# Patient Record
Sex: Male | Born: 1950 | Race: White | Hispanic: No | Smoking: Never smoker
Health system: Southern US, Community
[De-identification: ages and names within clinical notes are randomized; demographics above are authoritative.]

## PROBLEM LIST (undated history)

## (undated) DIAGNOSIS — D500 Iron deficiency anemia secondary to blood loss (chronic): Secondary | ICD-10-CM

## (undated) DIAGNOSIS — D869 Sarcoidosis, unspecified: Secondary | ICD-10-CM

## (undated) DIAGNOSIS — Z992 Dependence on renal dialysis: Secondary | ICD-10-CM

## (undated) DIAGNOSIS — N186 End stage renal disease: Secondary | ICD-10-CM

## (undated) DIAGNOSIS — I2510 Atherosclerotic heart disease of native coronary artery without angina pectoris: Secondary | ICD-10-CM

## (undated) DIAGNOSIS — I10 Essential (primary) hypertension: Secondary | ICD-10-CM

## (undated) DIAGNOSIS — F32A Depression, unspecified: Secondary | ICD-10-CM

## (undated) DIAGNOSIS — I255 Ischemic cardiomyopathy: Secondary | ICD-10-CM

## (undated) DIAGNOSIS — I214 Non-ST elevation (NSTEMI) myocardial infarction: Secondary | ICD-10-CM

## (undated) DIAGNOSIS — J9600 Acute respiratory failure, unspecified whether with hypoxia or hypercapnia: Secondary | ICD-10-CM

## (undated) DIAGNOSIS — F329 Major depressive disorder, single episode, unspecified: Secondary | ICD-10-CM

## (undated) DIAGNOSIS — R570 Cardiogenic shock: Secondary | ICD-10-CM

## (undated) HISTORY — PX: OTHER SURGICAL HISTORY: SHX169

## (undated) HISTORY — PX: AV FISTULA PLACEMENT, RADIOCEPHALIC: SHX1208

---

## 2006-04-11 ENCOUNTER — Emergency Department (HOSPITAL_COMMUNITY): Admission: EM | Admit: 2006-04-11 | Discharge: 2006-04-11 | Payer: Self-pay | Admitting: Emergency Medicine

## 2006-06-25 HISTORY — PX: NEPHRECTOMY: SHX65

## 2006-08-25 ENCOUNTER — Emergency Department (HOSPITAL_COMMUNITY): Admission: EM | Admit: 2006-08-25 | Discharge: 2006-08-26 | Payer: Self-pay | Admitting: Emergency Medicine

## 2011-05-26 DIAGNOSIS — I214 Non-ST elevation (NSTEMI) myocardial infarction: Secondary | ICD-10-CM

## 2011-05-26 DIAGNOSIS — N186 End stage renal disease: Secondary | ICD-10-CM

## 2011-05-26 DIAGNOSIS — R570 Cardiogenic shock: Secondary | ICD-10-CM

## 2011-05-26 DIAGNOSIS — J9600 Acute respiratory failure, unspecified whether with hypoxia or hypercapnia: Secondary | ICD-10-CM

## 2011-05-26 DIAGNOSIS — D500 Iron deficiency anemia secondary to blood loss (chronic): Secondary | ICD-10-CM

## 2011-05-26 HISTORY — DX: Acute respiratory failure, unspecified whether with hypoxia or hypercapnia: J96.00

## 2011-05-26 HISTORY — PX: CORONARY ARTERY BYPASS GRAFT: SHX141

## 2011-05-26 HISTORY — DX: Non-ST elevation (NSTEMI) myocardial infarction: I21.4

## 2011-05-26 HISTORY — DX: End stage renal disease: N18.6

## 2011-05-26 HISTORY — DX: Cardiogenic shock: R57.0

## 2011-05-26 HISTORY — DX: Iron deficiency anemia secondary to blood loss (chronic): D50.0

## 2011-07-02 ENCOUNTER — Emergency Department (HOSPITAL_COMMUNITY): Payer: Medicare Other

## 2011-07-02 ENCOUNTER — Encounter: Payer: Self-pay | Admitting: *Deleted

## 2011-07-02 ENCOUNTER — Inpatient Hospital Stay (HOSPITAL_COMMUNITY)
Admission: EM | Admit: 2011-07-02 | Discharge: 2011-07-04 | DRG: 388 | Disposition: A | Discharge status: Home / Self Care | Payer: Medicare Other | Attending: Internal Medicine | Admitting: Internal Medicine

## 2011-07-02 ENCOUNTER — Other Ambulatory Visit: Payer: Self-pay

## 2011-07-02 DIAGNOSIS — D631 Anemia in chronic kidney disease: Secondary | ICD-10-CM | POA: Diagnosis present

## 2011-07-02 DIAGNOSIS — E119 Type 2 diabetes mellitus without complications: Secondary | ICD-10-CM | POA: Diagnosis present

## 2011-07-02 DIAGNOSIS — R9431 Abnormal electrocardiogram [ECG] [EKG]: Secondary | ICD-10-CM | POA: Diagnosis present

## 2011-07-02 DIAGNOSIS — I120 Hypertensive chronic kidney disease with stage 5 chronic kidney disease or end stage renal disease: Secondary | ICD-10-CM | POA: Diagnosis present

## 2011-07-02 DIAGNOSIS — I10 Essential (primary) hypertension: Secondary | ICD-10-CM | POA: Diagnosis present

## 2011-07-02 DIAGNOSIS — K560 Paralytic ileus: Principal | ICD-10-CM | POA: Diagnosis present

## 2011-07-02 DIAGNOSIS — N039 Chronic nephritic syndrome with unspecified morphologic changes: Secondary | ICD-10-CM | POA: Diagnosis present

## 2011-07-02 DIAGNOSIS — I2510 Atherosclerotic heart disease of native coronary artery without angina pectoris: Secondary | ICD-10-CM | POA: Diagnosis present

## 2011-07-02 DIAGNOSIS — L03319 Cellulitis of trunk, unspecified: Secondary | ICD-10-CM | POA: Diagnosis present

## 2011-07-02 DIAGNOSIS — K567 Ileus, unspecified: Secondary | ICD-10-CM | POA: Diagnosis present

## 2011-07-02 DIAGNOSIS — I252 Old myocardial infarction: Secondary | ICD-10-CM

## 2011-07-02 DIAGNOSIS — L02219 Cutaneous abscess of trunk, unspecified: Secondary | ICD-10-CM | POA: Diagnosis present

## 2011-07-02 DIAGNOSIS — T360X5A Adverse effect of penicillins, initial encounter: Secondary | ICD-10-CM | POA: Diagnosis present

## 2011-07-02 DIAGNOSIS — D500 Iron deficiency anemia secondary to blood loss (chronic): Secondary | ICD-10-CM | POA: Diagnosis present

## 2011-07-02 DIAGNOSIS — D649 Anemia, unspecified: Secondary | ICD-10-CM

## 2011-07-02 DIAGNOSIS — N186 End stage renal disease: Secondary | ICD-10-CM | POA: Diagnosis present

## 2011-07-02 DIAGNOSIS — E860 Dehydration: Secondary | ICD-10-CM

## 2011-07-02 DIAGNOSIS — E870 Hyperosmolality and hypernatremia: Secondary | ICD-10-CM | POA: Diagnosis present

## 2011-07-02 DIAGNOSIS — D869 Sarcoidosis, unspecified: Secondary | ICD-10-CM | POA: Diagnosis present

## 2011-07-02 DIAGNOSIS — R112 Nausea with vomiting, unspecified: Secondary | ICD-10-CM | POA: Diagnosis present

## 2011-07-02 DIAGNOSIS — Z951 Presence of aortocoronary bypass graft: Secondary | ICD-10-CM

## 2011-07-02 DIAGNOSIS — L03313 Cellulitis of chest wall: Secondary | ICD-10-CM | POA: Diagnosis present

## 2011-07-02 HISTORY — DX: Ischemic cardiomyopathy: I25.5

## 2011-07-02 HISTORY — DX: Acute respiratory failure, unspecified whether with hypoxia or hypercapnia: J96.00

## 2011-07-02 HISTORY — DX: End stage renal disease: N18.6

## 2011-07-02 HISTORY — DX: Iron deficiency anemia secondary to blood loss (chronic): D50.0

## 2011-07-02 HISTORY — DX: Essential (primary) hypertension: I10

## 2011-07-02 HISTORY — DX: Major depressive disorder, single episode, unspecified: F32.9

## 2011-07-02 HISTORY — DX: Sarcoidosis, unspecified: D86.9

## 2011-07-02 HISTORY — DX: Cardiogenic shock: R57.0

## 2011-07-02 HISTORY — DX: Dependence on renal dialysis: Z99.2

## 2011-07-02 HISTORY — DX: Depression, unspecified: F32.A

## 2011-07-02 HISTORY — DX: Non-ST elevation (NSTEMI) myocardial infarction: I21.4

## 2011-07-02 HISTORY — DX: Atherosclerotic heart disease of native coronary artery without angina pectoris: I25.10

## 2011-07-02 LAB — COMPREHENSIVE METABOLIC PANEL
AST: 17 U/L (ref 0–37)
Albumin: 3 g/dL — ABNORMAL LOW (ref 3.5–5.2)
Alkaline Phosphatase: 88 U/L (ref 39–117)
BUN: 23 mg/dL (ref 6–23)
Chloride: 102 mEq/L (ref 96–112)
Potassium: 3.9 mEq/L (ref 3.5–5.1)
Total Bilirubin: 0.6 mg/dL (ref 0.3–1.2)

## 2011-07-02 LAB — CARDIAC PANEL(CRET KIN+CKTOT+MB+TROPI)
CK, MB: 5.3 ng/mL — ABNORMAL HIGH (ref 0.3–4.0)
Relative Index: INVALID (ref 0.0–2.5)
Total CK: 66 U/L (ref 7–232)
Troponin I: <0.3 ng/mL (ref <0.30)

## 2011-07-02 LAB — DIFFERENTIAL
Basophils Absolute: 0 10*3/uL (ref 0.0–0.1)
Lymphocytes Relative: 7 % — ABNORMAL LOW (ref 12–46)
Lymphs Abs: 0.6 10*3/uL — ABNORMAL LOW (ref 0.7–4.0)
Monocytes Absolute: 0.9 10*3/uL (ref 0.1–1.0)
Neutro Abs: 7.8 10*3/uL — ABNORMAL HIGH (ref 1.7–7.7)

## 2011-07-02 LAB — CBC
HCT: 35.6 % — ABNORMAL LOW (ref 39.0–52.0)
Hemoglobin: 11.1 g/dL — ABNORMAL LOW (ref 13.0–17.0)
RBC: 3.92 MIL/uL — ABNORMAL LOW (ref 4.22–5.81)
RDW: 16.9 % — ABNORMAL HIGH (ref 11.5–15.5)
WBC: 9.5 10*3/uL (ref 4.0–10.5)

## 2011-07-02 LAB — PRO B NATRIURETIC PEPTIDE: Pro B Natriuretic peptide (BNP): 32482 pg/mL — ABNORMAL HIGH (ref 0–125)

## 2011-07-02 MED ORDER — ONDANSETRON HCL 4 MG/2ML IJ SOLN
4.0000 mg | Freq: Once | INTRAMUSCULAR | Status: AC
  Administered 2011-07-02: 4 mg via INTRAVENOUS
  Filled 2011-07-02: qty 2

## 2011-07-02 MED ORDER — ENOXAPARIN SODIUM 30 MG/0.3ML ~~LOC~~ SOLN
30.0000 mg | SUBCUTANEOUS | Status: DC
  Administered 2011-07-02 – 2011-07-03 (×2): 30 mg via SUBCUTANEOUS
  Filled 2011-07-02: qty 0.3
  Filled 2011-07-02: qty 1

## 2011-07-02 MED ORDER — PANTOPRAZOLE SODIUM 40 MG IV SOLR
40.0000 mg | INTRAVENOUS | Status: DC
  Administered 2011-07-02 – 2011-07-03 (×2): 40 mg via INTRAVENOUS
  Filled 2011-07-02 (×2): qty 40

## 2011-07-02 MED ORDER — VANCOMYCIN HCL IN DEXTROSE 1-5 GM/200ML-% IV SOLN
1000.0000 mg | Freq: Once | INTRAVENOUS | Status: AC
  Administered 2011-07-02: 1000 mg via INTRAVENOUS
  Filled 2011-07-02: qty 200

## 2011-07-02 MED ORDER — SODIUM CHLORIDE 0.9 % IV BOLUS (SEPSIS)
500.0000 mL | Freq: Once | INTRAVENOUS | Status: AC
  Administered 2011-07-02: 10:00:00 via INTRAVENOUS

## 2011-07-02 MED ORDER — INSULIN ASPART 100 UNIT/ML ~~LOC~~ SOLN
0.0000 [IU] | SUBCUTANEOUS | Status: DC
  Administered 2011-07-03: 1 [IU] via SUBCUTANEOUS
  Administered 2011-07-03: 2 [IU] via SUBCUTANEOUS
  Administered 2011-07-03 (×2): 1 [IU] via SUBCUTANEOUS
  Administered 2011-07-03 – 2011-07-04 (×2): 2 [IU] via SUBCUTANEOUS
  Administered 2011-07-04: 1 [IU] via SUBCUTANEOUS
  Filled 2011-07-02: qty 3

## 2011-07-02 MED ORDER — PROMETHAZINE HCL 25 MG/ML IJ SOLN
12.5000 mg | Freq: Once | INTRAMUSCULAR | Status: AC
  Administered 2011-07-02: 12.5 mg via INTRAVENOUS
  Filled 2011-07-02: qty 1

## 2011-07-02 MED ORDER — HYDROCORTISONE SOD SUCCINATE 100 MG IJ SOLR
25.0000 mg | Freq: Every day | INTRAMUSCULAR | Status: DC
  Administered 2011-07-02: 22:00:00 via INTRAVENOUS
  Administered 2011-07-03: 25 mg via INTRAVENOUS
  Filled 2011-07-02 (×4): qty 2

## 2011-07-02 MED ORDER — ONDANSETRON HCL 4 MG/2ML IJ SOLN
4.0000 mg | Freq: Four times a day (QID) | INTRAMUSCULAR | Status: DC
  Administered 2011-07-02 – 2011-07-04 (×7): 4 mg via INTRAVENOUS
  Filled 2011-07-02 (×8): qty 2

## 2011-07-02 MED ORDER — LEVALBUTEROL HCL 0.63 MG/3ML IN NEBU: 0.6300 mg | INHALATION_SOLUTION | RESPIRATORY_TRACT | Status: DC | PRN

## 2011-07-02 MED ORDER — ACETAMINOPHEN 325 MG PO TABS
650.0000 mg | ORAL_TABLET | Freq: Four times a day (QID) | ORAL | Status: DC | PRN
  Administered 2011-07-04: 650 mg via ORAL
  Filled 2011-07-02: qty 2

## 2011-07-02 MED ORDER — MORPHINE SULFATE 2 MG/ML IJ SOLN
4.0000 mg | INTRAMUSCULAR | Status: DC | PRN
  Filled 2011-07-02: qty 2

## 2011-07-02 MED ORDER — DEXTROSE-NACL 5-0.9 % IV SOLN
INTRAVENOUS | Status: DC
  Administered 2011-07-02: 21:00:00 via INTRAVENOUS

## 2011-07-02 MED ORDER — GUAIFENESIN-DM 100-10 MG/5ML PO SYRP: 5.0000 mL | ORAL_SOLUTION | ORAL | Status: DC | PRN

## 2011-07-02 MED ORDER — METOPROLOL TARTRATE 1 MG/ML IV SOLN
5.0000 mg | Freq: Four times a day (QID) | INTRAVENOUS | Status: DC
  Administered 2011-07-03 (×2): 5 mg via INTRAVENOUS
  Filled 2011-07-02 (×3): qty 5

## 2011-07-02 MED ORDER — MORPHINE SULFATE 4 MG/ML IJ SOLN
2.0000 mg | Freq: Once | INTRAMUSCULAR | Status: AC
  Administered 2011-07-02: 2 mg via INTRAVENOUS
  Filled 2011-07-02: qty 1

## 2011-07-02 MED ORDER — ACETAMINOPHEN 650 MG RE SUPP: 650.0000 mg | Freq: Four times a day (QID) | RECTAL | Status: DC | PRN

## 2011-07-02 NOTE — ED Provider Notes (Signed)
Pt seen with PA Idol EKG reviewed Pt with recent CABG now with recurrent vomiting abd soft Sternotomy incision well healing Labs noted, unclear how acute troponin elevation is s/p CABD Will need consultation with his physician at [facility] BP 132/78  Pulse 85  Temp(Src) 97.8 F (36.6 C) (Oral)  Resp 16  Ht 5\' 9"  (1.753 m)  Wt 147 lb 11.3 oz (67 kg)  BMI 21.81 kg/m2  SpO2 100%   Joya Gaskins, MD 07/02/11 1057

## 2011-07-02 NOTE — ED Notes (Signed)
Leona Singleton PA aware of Trop value. Orders to get records from [facility] for recent bypass surgery.

## 2011-07-02 NOTE — ED Notes (Signed)
Pt unable to eat since discharge from bypass surgery at [facility] week prior (surgery Tuesday, discharged Saturday). Pt complaining of N/V with headache and anxiety.

## 2011-07-02 NOTE — ED Notes (Signed)
[facility] called back about records that were requested. Will fax to [facility]

## 2011-07-02 NOTE — ED Notes (Signed)
Dr. Sherrie Mustache here for evaluation of pt

## 2011-07-02 NOTE — H&P (Addendum)
Juan Rowe MRN: [id number] DOB/AGE: 1951/04/20 61 y.o. Primary Care Physician: [facility] Admit date: 07/02/2011 Chief Complaint: Nausea and vomiting HPI: The patient is a 61 year old man with a past medical history significant for coronary artery disease, type 2 diabetes mellitus, and chronic kidney disease. His history is also significant for a recent hospitalization at [facility] from 06/09/2011 through 06/30/2011 for treatment of a non-ST elevation myocardial infarction. During the hospitalization there, he developed cardiogenic shock and acute respiratory failure necessitating intubation and ventilation. Subsequently, he underwent a two-vessel CABG. His renal function worsened and therefore dialysis was started. Because of blood loss, he was transfused 2 units of packed red blood cells. During his last day at [facility], his appetite was poor and he was eating very little. Since the time that he had returned home, he has had several episodes of nausea and vomiting. He has only mild abdominal pain, but his major complaint is of nausea and vomiting. He denies coffee grounds emesis. His last bowel movement was earlier this morning. He has no pain with urination. He has no fever or chills. He has no chest pain or shortness of breath.  In the emergency department, he is noted to be afebrile and hemodynamically stable. His lab data are significant for a normal troponin I., elevated serum sodium of 147, elevated creatinine of 4.52, mild anemia with a hemoglobin of 11.1, and an abdominal x-ray that reveals findings suggestive of an ileus versus partial small bowel obstruction. His lipase and LFTs were within normal limits. He is being admitted for further evaluation and management.  Past Medical History  Diagnosis Date  . Coronary artery disease   . MI, acute, non ST segment elevation December 2012    [facility]  . Diabetes mellitus   . Ischemic cardiomyopathy     . End stage renal disease on dialysis December 2012    Started dialysis  . Sarcoidosis   . Gout   . Renal cell carcinoma 2008  . Hypertension   . Blood loss anemia December 2012    Status post CABG. Transfused 2 units blood  . Acute respiratory failure December 2012    Secondary to MI  . Cardiogenic shock December 2012  . Depression     Past Surgical History  Procedure Date  . Coronary artery bypass graft December 2012    Two-vessel, [facility].  . Nephrectomy 2008    On the left  . Double-j ureteral stent placed   . Av fistula placement, radiocephalic     Prior to Admission medications   Medication Sig Start Date End Date Taking? Authorizing Provider  acetaminophen (TYLENOL) 325 MG tablet Take 325 mg by mouth every 6 (six) hours as needed.     Yes Historical Provider, MD  allopurinol (ZYLOPRIM) 100 MG tablet Take 100 mg by mouth every Monday, Wednesday, and Friday.     Yes Historical Provider, MD  amoxicillin-clavulanate (AUGMENTIN) 500-125 MG per tablet Take 1 tablet by mouth at bedtime.   06/30/11 07/14/11 Yes Historical Provider, MD  aspirin EC 81 MG tablet Take 81 mg by mouth daily.     Yes Historical Provider, MD  calcitRIOL (ROCALTROL) 0.25 MCG capsule Take 0.25 mcg by mouth every Monday, Wednesday, and Friday.     Yes Historical Provider, MD  calcium acetate (PHOSLO) 667 MG capsule Take 667 mg by mouth 3 (three) times daily with meals.     Yes Historical Provider, MD  docusate sodium (COLACE)  100 MG capsule Take 100 mg by mouth 2 (two) times daily as needed. For constipation    Yes Historical Provider, MD  escitalopram (LEXAPRO) 10 MG tablet Take 10 mg by mouth daily.     Yes Historical Provider, MD  insulin aspart (NOVOLOG) 100 UNIT/ML injection Inject 3 Units into the skin 3 (three) times daily before meals.     Yes Historical Provider, MD  insulin glargine (LANTUS) 100 UNIT/ML injection Inject 14 Units into the skin at bedtime.     Yes Historical Provider, MD   metoprolol tartrate (LOPRESSOR) 25 MG tablet Take 12.5 mg by mouth 2 (two) times daily.     Yes Historical Provider, MD  omeprazole (PRILOSEC) 20 MG capsule Take 20 mg by mouth daily.     Yes Historical Provider, MD  oxyCODONE (OXY IR/ROXICODONE) 5 MG immediate release tablet Take 5 mg by mouth every 6 (six) hours as needed. For pain    Yes Historical Provider, MD  predniSONE (DELTASONE) 2.5 MG tablet Take 7.5 mg by mouth daily.     Yes Historical Provider, MD  senna (SENOKOT) 8.6 MG TABS Take 2 tablets by mouth at bedtime as needed. For constipation    Yes Historical Provider, MD  simvastatin (ZOCOR) 40 MG tablet Take 40 mg by mouth every evening.     Yes Historical Provider, MD  triamcinolone cream (KENALOG) 0.1 % Apply 1 application topically 2 (two) times daily.     Yes Historical Provider, MD    Allergies:  Allergies  Allergen Reactions  . Hydrocodone Nausea Only  . Sudafed (Pseudoephedrine)     Family history: His mother is deceased due to cancer. His father is deceased due to COPD.  Social History: He is separated. He has one daughter. He lives in [place]. He is retired from [employer]. He denies alcohol, tobacco, and illicit drug use.      ROS: As above in history present illness. Otherwise review of systems is negative.  PHYSICAL EXAM: Blood pressure 117/64, pulse 94, temperature 97.8 F (36.6 C), temperature source Oral, resp. rate 16, height 5\' 9"  (1.753 m), weight 67 kg (147 lb 11.3 oz), SpO2 95.00%.  General: Pleasant 61 year old Caucasian man who is currently lying in bed, in no acute distress. HEENT: Head is normocephalic, nontraumatic. Pupils are equal, round, and reactive to light. Extraocular movements are intact. Conjunctivae are clear. Sclerae are white. Oropharynx reveals mildly dry mucous membranes. No posterior exudates or erythema. Neck: Supple, no adenopathy, no thyromegaly, no JVD. Chest wall: Dialysis catheter located at the right upper chest wall.  Mild erythema around the catheter. There is a a healing sternotomy scar with surrounding erythema but no active drainage. Mildly tender to palpation. Lungs: Decreased breath sounds in the bases, otherwise clear. Heart: S1, S2, with an ectopic beat and soft systolic murmur. Abdomen: Positive bowel sounds, soft, mildly tender in the epigastrium, mildly distended, no masses palpated, no hepatosplenomegaly. Extremities: No pedal edema. Palpable AV fistula without strong thrill. Neurologic: Alert and oriented x3. Cranial nerves II through XII are intact.  Basic Metabolic Panel:  Basename 07/02/11 1029  NA 147*  K 3.9  CL 102  CO2 28  GLUCOSE 122*  BUN 23  CREATININE 4.52*  CALCIUM 9.3  MG --  PHOS --   Liver Function Tests:  Basename 07/02/11 1029  AST 17  ALT 13  ALKPHOS 88  BILITOT 0.6  PROT 6.0  ALBUMIN 3.0*    Basename 07/02/11 1029  LIPASE 52  AMYLASE --  No results found for this basename: AMMONIA:2 in the last 72 hours CBC:  Basename 07/02/11 1028  WBC 9.5  NEUTROABS 7.8*  HGB 11.1*  HCT 35.6*  MCV 90.8  PLT 312   Cardiac Enzymes:  Basename 07/02/11 1028  CKTOTAL --  CKMB --  CKMBINDEX --  TROPONINI <0.30   BNP: No results found for this basename: PROBNP:3 in the last 72 hours D-Dimer: No results found for this basename: DDIMER:2 in the last 72 hours CBG: No results found for this basename: GLUCAP:6 in the last 72 hours Hemoglobin A1C: No results found for this basename: HGBA1C in the last 72 hours Fasting Lipid Panel: No results found for this basename: CHOL,HDL,LDLCALC,TRIG,CHOLHDL,LDLDIRECT in the last 72 hours Thyroid Function Tests: No results found for this basename: TSH,T4TOTAL,FREET4,T3FREE,THYROIDAB in the last 72 hours Anemia Panel: No results found for this basename: VITAMINB12,FOLATE,FERRITIN,TIBC,IRON,RETICCTPCT in the last 72 hours Coagulation: No results found for this basename: LABPROT:2,INR:2 in the last 72 hours Urine Drug  Screen: Drugs of Abuse  No results found for this basename: labopia, cocainscrnur, labbenz, amphetmu, thcu, labbarb    Alcohol Level: No results found for this basename: ETH:2 in the last 72 hours    EKG: Heart rate 82 these per minute, ST and T wave abnormalities in the lateral leads, prolonged QT interval of 422/493 to  No results found for this or any previous visit (from the past 240 hour(s)).   Results for orders placed during the hospital encounter of 07/02/11 (from the past 48 hour(s))  POCT I-STAT TROPONIN I     Status: Abnormal   Collection Time   07/02/11 10:21 AM      Component Value Range Comment   Troponin i, poc 0.15 (*) 0.00 - 0.08 (ng/mL)    Comment NOTIFIED PHYSICIAN      Comment 3            CBC     Status: Abnormal   Collection Time   07/02/11 10:28 AM      Component Value Range Comment   WBC 9.5  4.0 - 10.5 (K/uL)    RBC 3.92 (*) 4.22 - 5.81 (MIL/uL)    Hemoglobin 11.1 (*) 13.0 - 17.0 (g/dL)    HCT 16.1 (*) 09.6 - 52.0 (%)    MCV 90.8  78.0 - 100.0 (fL)    MCH 28.3  26.0 - 34.0 (pg)    MCHC 31.2  30.0 - 36.0 (g/dL)    RDW 04.5 (*) 40.9 - 15.5 (%)    Platelets 312  150 - 400 (K/uL)   DIFFERENTIAL     Status: Abnormal   Collection Time   07/02/11 10:28 AM      Component Value Range Comment   Neutrophils Relative 82 (*) 43 - 77 (%)    Neutro Abs 7.8 (*) 1.7 - 7.7 (K/uL)    Lymphocytes Relative 7 (*) 12 - 46 (%)    Lymphs Abs 0.6 (*) 0.7 - 4.0 (K/uL)    Monocytes Relative 9  3 - 12 (%)    Monocytes Absolute 0.9  0.1 - 1.0 (K/uL)    Eosinophils Relative 2  0 - 5 (%)    Eosinophils Absolute 0.2  0.0 - 0.7 (K/uL)    Basophils Relative 0  0 - 1 (%)    Basophils Absolute 0.0  0.0 - 0.1 (K/uL)   TROPONIN I     Status: Normal   Collection Time   07/02/11 10:28 AM      Component  Value Range Comment   Troponin I <0.30  <0.30 (ng/mL)   COMPREHENSIVE METABOLIC PANEL     Status: Abnormal   Collection Time   07/02/11 10:29 AM      Component Value Range Comment    Sodium 147 (*) 135 - 145 (mEq/L)    Potassium 3.9  3.5 - 5.1 (mEq/L)    Chloride 102  96 - 112 (mEq/L)    CO2 28  19 - 32 (mEq/L)    Glucose, Bld 122 (*) 70 - 99 (mg/dL)    BUN 23  6 - 23 (mg/dL)    Creatinine, Ser 1.61 (*) 0.50 - 1.35 (mg/dL)    Calcium 9.3  8.4 - 10.5 (mg/dL)    Total Protein 6.0  6.0 - 8.3 (g/dL)    Albumin 3.0 (*) 3.5 - 5.2 (g/dL)    AST 17  0 - 37 (U/L)    ALT 13  0 - 53 (U/L)    Alkaline Phosphatase 88  39 - 117 (U/L)    Total Bilirubin 0.6  0.3 - 1.2 (mg/dL)    GFR calc non Af Amer 13 (*) >90 (mL/min)    GFR calc Af Amer 15 (*) >90 (mL/min)   LIPASE, BLOOD     Status: Normal   Collection Time   07/02/11 10:29 AM      Component Value Range Comment   Lipase 52  11 - 59 (U/L)     Dg Chest 1 View  07/02/2011  *RADIOLOGY REPORT*  Clinical Data: Cough and shortness of breath.  Rule out pneumonia. Lateral view requested by radiologist.  CHEST - 1 VIEW  Comparison: Portable chest x-ray 07/02/2011  Findings: When compared to the frontal portable chest x-ray from earlier today, the findings on the frontal portable chest x-ray appear to be linear opacities most consistent with subsegmental atelectasis.  In addition, there is minimal blunting of the left costophrenic sulcus, indicative of a small left-sided pleural effusion.  The patient is status post median sternotomy for CABG. A perm catheter is in place with tip projecting in the expected location of the superior cavoatrial junction.  IMPRESSION:  1.  Together with findings from the portable view of the chest from earlier today, the appearance is most consistent with a small left- sided pleural effusion with adjacent subsegmental atelectasis in the left lower lobe.  No definite focal airspace consolidation is appreciated to suggest pneumonia at this time.  Original Report Authenticated By: Florencia Reasons, M.D.   Dg Abd 1 View  07/02/2011  *RADIOLOGY REPORT*  Clinical Data: Vomiting.  ABDOMEN - 1 VIEW  Comparison: None.   Findings: There are gas-filled loops of small bowel in the left abdomen.  There is gas within the colon.  Multiple surgical clips throughout the abdomen.  Limited evaluation for free air.  IMPRESSION: Air-filled loops of small bowel in the left abdomen with gas in the colon.  Differential diagnosis includes an ileus versus early small bowel obstruction.  Original Report Authenticated By: Richarda Overlie, M.D.   Dg Chest Portable 1 View  07/02/2011  *RADIOLOGY REPORT*  Clinical Data: Vomiting.  PORTABLE CHEST - 1 VIEW  Comparison: None.  Findings: Right internal jugular central venous catheter tip projects over the lower SVC.  Prior CABG noted.  There is retrodiaphragmatic opacity on the left which is not entirely specific, and could reflect atelectasis or pneumonia.  Lateral radiography, if feasible, would be recommended.  Linear subsegmental atelectasis noted in the left midlung. Low lung volumes  are present, causing crowding of the pulmonary vasculature. Heart given the low lung volumes, heart size is within normal limits.  IMPRESSION:  1.  Retrodiaphragmatic opacity on the left, not entirely specific. This could be from atelectasis, a small pleural effusion, or pneumonia.  If feasible, lateral chest radiography may be helpful in differentiation.  2.  Subsegmental atelectasis in the left midlung.  Original Report Authenticated By: Dellia Cloud, M.D.    Impression:  Active Problems:  Nausea and vomiting  Ileus  End stage renal disease  Hypernatremia  Diabetes mellitus type 2 in nonobese  Coronary artery disease  Hypertension  Anemia  Cellulitis of chest wall  Prolonged Q-T interval on ECG   1. Intractable nausea and vomiting, likely secondary to ileus versus early small bowel obstruction. He did have a bowel movement this morning. His abdomen is soft and only mildly distended.  2. Coronary artery/ischemic cardiomyopathy/recent 2 vessel CABG. Noted QT prolongation on the EKG which may be the  consequence of recent MI and CABG.  3. End stage renal disease, on dialysis which started during the hospitalization last month at [facility]  4. Cellulitis of the chest wall, status post sternotomy/CABG.  5. Hypertension. Currently controlled.  6. Type 2 diabetes mellitus, currently controlled.  7. Hypernatremia, likely secondary to dehydration from nausea and vomiting.  8. Anemia. The patient developed acute blood loss anemia during the hospitalization in December at [facility] He was transfused. Currently, his hemoglobin is not very low under circumstances.  Plan:  1. The patient will be made n.p.o. for bowel rest. We'll treat him supportively with antiemetics and analgesics IV. We will add Protonix intravenously. We will schedule Zofran every 6 hours.  Nephrologist Dr. Kristian Covey has been consulted and he will see the patient tomorrow. He plans to dialyze him.  Sliding scale NovoLog for treatment of diabetes.  IV hydrocortisone will be given in place of oral prednisone.  Gentle IV fluids for hydration.  Vancomycin for treatment of cellulitis of the chest wall.  For further evaluation, we'll order cardiac enzymes, TSH, hemoglobin A1c, pro BNP, and magnesium level.  We'll consider transferring the patient back to [facility] for continuity of care. We'll hold off on general surgery consultation for now.       Lan Mcneill 07/02/2011, 6:53 PM

## 2011-07-02 NOTE — ED Notes (Signed)
C/o headache. Will notify Burgess Amor PA

## 2011-07-02 NOTE — ED Provider Notes (Signed)
History     CSN: [id number]  Arrival date & time 07/02/11  4401   First MD Initiated Contact with Patient 07/02/11 1001      Chief Complaint  Patient presents with  . Nausea  . Emesis  . Anxiety  . Headache    (Consider location/radiation/quality/duration/timing/severity/associated sxs/prior treatment) HPI Comments: Patient presents for evaluation of nausea,  Vomiting, and increasing dehydration.  He was admitted at [facility] 06/09/11 with an acute MI,  And underwent subsequent CABG x 2.  On 06/21/11.   During this admission,  His dialysis was also instituted,  Having had ESRD with acute on chronic renal failure during this admission.  He was supposed to see Dr Fausto Skillern this am to arrange for his next dialysis tomorrow,  But missed this appointment since he was here in the ed.  He continues to have nausea with epigastric discomfort,  Made worse with attempt at PO fluids here in the ed.  He reports increased weakness and feeling dehydrated.  Patient is a 61 y.o. male presenting with vomiting. The history is provided by the patient.  Emesis  This is a recurrent (Patient presents with anorexia,  nausea and vomiting when he tried to eat or drink since he was discharged from [facility] 2 days ago.) problem. The problem occurs more than 10 times per day. The emesis has an appearance of stomach contents (Also with dry heaves.). There has been no fever. Associated symptoms include abdominal pain. Pertinent negatives include no arthralgias, no cough, no diarrhea, no fever and no headaches. Associated symptoms comments: Reports anxiety..    Past Medical History  Diagnosis Date  . Cancer   . Coronary artery disease   . Diabetes mellitus   . Hypertension   . Renal disorder   . MI (myocardial infarction)   . Gout     Past Surgical History  Procedure Date  . Cardiac surgery   . Kidney surgery     History reviewed. No pertinent family history.  History  Substance Use Topics  . Smoking  status: Not on file  . Smokeless tobacco: Not on file  . Alcohol Use:       Review of Systems  Constitutional: Positive for appetite change and fatigue. Negative for fever.  HENT: Negative for congestion, sore throat and neck pain.   Eyes: Negative.   Respiratory: Negative for cough, chest tightness and shortness of breath.   Cardiovascular: Negative for chest pain and palpitations.  Gastrointestinal: Positive for nausea, vomiting and abdominal pain. Negative for diarrhea and abdominal distention.  Genitourinary: Positive for decreased urine volume.  Musculoskeletal: Negative for joint swelling and arthralgias.  Skin: Negative.  Negative for rash and wound.  Neurological: Positive for weakness. Negative for dizziness, light-headedness, numbness and headaches.  Hematological: Negative.   Psychiatric/Behavioral: Negative.     Allergies  Hydrocodone and Sudafed  Home Medications   Current Outpatient Rx  Name Route Sig Dispense Refill  . ACETAMINOPHEN 325 MG PO TABS Oral Take 325 mg by mouth every 6 (six) hours as needed.      . ALLOPURINOL 100 MG PO TABS Oral Take 100 mg by mouth every Monday, Wednesday, and Friday.      . AMOXICILLIN-POT CLAVULANATE 500-125 MG PO TABS Oral Take 1 tablet by mouth at bedtime.      . ASPIRIN EC 81 MG PO TBEC Oral Take 81 mg by mouth daily.      Marland Kitchen CALCITRIOL 0.25 MCG PO CAPS Oral Take 0.25 mcg by mouth  every Monday, Wednesday, and Friday.      Marland Kitchen CALCIUM ACETATE 667 MG PO CAPS Oral Take 667 mg by mouth 3 (three) times daily with meals.      . DOCUSATE SODIUM 100 MG PO CAPS Oral Take 100 mg by mouth 2 (two) times daily as needed. For constipation     . ESCITALOPRAM OXALATE 10 MG PO TABS Oral Take 10 mg by mouth daily.      . INSULIN ASPART 100 UNIT/ML [place] SOLN Subcutaneous Inject 3 Units into the skin 3 (three) times daily before meals.      . INSULIN GLARGINE 100 UNIT/ML [place] SOLN Subcutaneous Inject 14 Units into the skin at bedtime.      Marland Kitchen METOPROLOL  TARTRATE 25 MG PO TABS Oral Take 12.5 mg by mouth 2 (two) times daily.      Marland Kitchen OMEPRAZOLE 20 MG PO CPDR Oral Take 20 mg by mouth daily.      . OXYCODONE HCL 5 MG PO TABS Oral Take 5 mg by mouth every 6 (six) hours as needed. For pain     . PREDNISONE 2.5 MG PO TABS Oral Take 7.5 mg by mouth daily.      . SENNA 8.6 MG PO TABS Oral Take 2 tablets by mouth at bedtime as needed. For constipation     . SIMVASTATIN 40 MG PO TABS Oral Take 40 mg by mouth every evening.      . TRIAMCINOLONE ACETONIDE 0.1 % EX CREA Topical Apply 1 application topically 2 (two) times daily.        BP 123/64  Pulse 93  Temp(Src) 97.8 F (36.6 C) (Oral)  Resp 16  Ht 5\' 9"  (1.753 m)  Wt 147 lb 11.3 oz (67 kg)  BMI 21.81 kg/m2  SpO2 95%  Physical Exam  Nursing note and vitals reviewed. Constitutional: He is oriented to person, place, and time. He appears well-developed and well-nourished.  HENT:  Head: Normocephalic and atraumatic.  Eyes: Conjunctivae are normal.  Neck: Normal range of motion.  Cardiovascular: Normal rate, regular rhythm, normal heart sounds and intact distal pulses.   Pulmonary/Chest: Effort normal and breath sounds normal. He has no wheezes. He has no rales. He exhibits tenderness.       Mild midline tenderness.  No erythema or swelling suggestive of incisional infection.  Abdominal: Soft. Bowel sounds are normal. He exhibits no mass. There is no hepatosplenomegaly. There is tenderness in the epigastric area. There is no rebound, no guarding and no CVA tenderness.  Musculoskeletal: Normal range of motion.  Neurological: He is alert and oriented to person, place, and time.  Skin: Skin is warm and dry.  Psychiatric: He has a normal mood and affect.    ED Course  Procedures (including critical care time)  Labs Reviewed  CBC - Abnormal; Notable for the following:    RBC 3.92 (*)    Hemoglobin 11.1 (*)    HCT 35.6 (*)    RDW 16.9 (*)    All other components within normal limits    DIFFERENTIAL - Abnormal; Notable for the following:    Neutrophils Relative 82 (*)    Neutro Abs 7.8 (*)    Lymphocytes Relative 7 (*)    Lymphs Abs 0.6 (*)    All other components within normal limits  COMPREHENSIVE METABOLIC PANEL - Abnormal; Notable for the following:    Sodium 147 (*)    Glucose, Bld 122 (*)    Creatinine, Ser 4.52 (*)  Albumin 3.0 (*)    GFR calc non Af Amer 13 (*)    GFR calc Af Amer 15 (*)    All other components within normal limits  POCT I-STAT TROPONIN I - Abnormal; Notable for the following:    Troponin i, poc 0.15 (*)    All other components within normal limits  TROPONIN I  LIPASE, BLOOD   Dg Chest 1 View  07/02/2011  *RADIOLOGY REPORT*  Clinical Data: Cough and shortness of breath.  Rule out pneumonia. Lateral view requested by radiologist.  CHEST - 1 VIEW  Comparison: Portable chest x-ray 07/02/2011  Findings: When compared to the frontal portable chest x-ray from earlier today, the findings on the frontal portable chest x-ray appear to be linear opacities most consistent with subsegmental atelectasis.  In addition, there is minimal blunting of the left costophrenic sulcus, indicative of a small left-sided pleural effusion.  The patient is status post median sternotomy for CABG. A perm catheter is in place with tip projecting in the expected location of the superior cavoatrial junction.  IMPRESSION:  1.  Together with findings from the portable view of the chest from earlier today, the appearance is most consistent with a small left- sided pleural effusion with adjacent subsegmental atelectasis in the left lower lobe.  No definite focal airspace consolidation is appreciated to suggest pneumonia at this time.  Original Report Authenticated By: Florencia Reasons, M.D.   Dg Chest Portable 1 View  07/02/2011  *RADIOLOGY REPORT*  Clinical Data: Vomiting.  PORTABLE CHEST - 1 VIEW  Comparison: None.  Findings: Right internal jugular central venous catheter tip  projects over the lower SVC.  Prior CABG noted.  There is retrodiaphragmatic opacity on the left which is not entirely specific, and could reflect atelectasis or pneumonia.  Lateral radiography, if feasible, would be recommended.  Linear subsegmental atelectasis noted in the left midlung. Low lung volumes are present, causing crowding of the pulmonary vasculature. Heart given the low lung volumes, heart size is within normal limits.  IMPRESSION:  1.  Retrodiaphragmatic opacity on the left, not entirely specific. This could be from atelectasis, a small pleural effusion, or pneumonia.  If feasible, lateral chest radiography may be helpful in differentiation.  2.  Subsegmental atelectasis in the left midlung.  Original Report Authenticated By: Dellia Cloud, M.D.     No diagnosis found.    Date: 07/02/2011  Rate: 82  Rhythm: normal sinus rhythm  QRS Axis: normal  Intervals: QT prolonged  ST/T Wave abnormalities: T wave inversion in lateral leads I and avl.  Conduction Disutrbances:none  Narrative Interpretation:   Old EKG Reviewed: unchanged  Old ekg from [facility] faxed chart dtd 06/22/11       MDM  Spoke with Dr. Juel Burrow of cvts at [facility] - discussed patients symptoms,  Labs.  Does not accept patient for transfer as sx do not sound like post surgical complication - with no sob or chest pain,  Suggests treating nausea,  Dehydration,  Arrange for dialysis here as originally planned.   Call to Dr Sherrie Mustache with [facility].  Will admit patient.  Patient also discussed with Dr Fausto Skillern who is aware of his admission and need for following for dialysis.        Candis Musa, PA 07/02/11 1800  Candis Musa, PA 07/02/11 [phone]

## 2011-07-02 NOTE — ED Notes (Signed)
Pt has no appetite to eat applesauce given and unable to tolerate drink

## 2011-07-03 ENCOUNTER — Encounter (HOSPITAL_COMMUNITY): Payer: Self-pay

## 2011-07-03 LAB — COMPREHENSIVE METABOLIC PANEL
BUN: 25 mg/dL — ABNORMAL HIGH (ref 6–23)
CO2: 27 mEq/L (ref 19–32)
Calcium: 8 mg/dL — ABNORMAL LOW (ref 8.4–10.5)
Creatinine, Ser: 4.4 mg/dL — ABNORMAL HIGH (ref 0.50–1.35)
GFR calc Af Amer: 15 mL/min — ABNORMAL LOW (ref >90)
GFR calc non Af Amer: 13 mL/min — ABNORMAL LOW (ref >90)
Glucose, Bld: 152 mg/dL — ABNORMAL HIGH (ref 70–99)
Sodium: 143 mEq/L (ref 135–145)
Total Protein: 5.5 g/dL — ABNORMAL LOW (ref 6.0–8.3)

## 2011-07-03 LAB — GLUCOSE, CAPILLARY
Glucose-Capillary: 144 mg/dL — ABNORMAL HIGH (ref 70–99)
Glucose-Capillary: 131 mg/dL — ABNORMAL HIGH (ref 70–99)
Glucose-Capillary: 147 mg/dL — ABNORMAL HIGH (ref 70–99)
Glucose-Capillary: 169 mg/dL — ABNORMAL HIGH (ref 70–99)

## 2011-07-03 LAB — CARDIAC PANEL(CRET KIN+CKTOT+MB+TROPI)
Relative Index: INVALID (ref 0.0–2.5)
Relative Index: INVALID (ref 0.0–2.5)
Total CK: 54 U/L (ref 7–232)
Troponin I: <0.3 ng/mL (ref <0.30)

## 2011-07-03 LAB — CBC
HCT: 29.1 % — ABNORMAL LOW (ref 39.0–52.0)
Hemoglobin: 8.9 g/dL — ABNORMAL LOW (ref 13.0–17.0)
MCHC: 30.6 g/dL (ref 30.0–36.0)
RBC: 3.19 MIL/uL — ABNORMAL LOW (ref 4.22–5.81)
WBC: 8.9 10*3/uL (ref 4.0–10.5)

## 2011-07-03 LAB — TSH: TSH: 0.265 u[IU]/mL — ABNORMAL LOW (ref 0.350–4.500)

## 2011-07-03 LAB — MRSA PCR SCREENING: MRSA by PCR: NEGATIVE

## 2011-07-03 LAB — PRO B NATRIURETIC PEPTIDE: Pro B Natriuretic peptide (BNP): 29888 pg/mL — ABNORMAL HIGH (ref 0–125)

## 2011-07-03 LAB — HEMOGLOBIN A1C: Hgb A1c MFr Bld: 6.6 % — ABNORMAL HIGH (ref <5.7)

## 2011-07-03 MED ORDER — METOPROLOL TARTRATE 25 MG PO TABS
12.5000 mg | ORAL_TABLET | Freq: Two times a day (BID) | ORAL | Status: DC
  Administered 2011-07-03 – 2011-07-04 (×3): 12.5 mg via ORAL
  Filled 2011-07-03 (×3): qty 1

## 2011-07-03 MED ORDER — FUROSEMIDE 10 MG/ML IJ SOLN
100.0000 mg | Freq: Two times a day (BID) | INTRAVENOUS | Status: DC
  Administered 2011-07-03 – 2011-07-04 (×2): 100 mg via INTRAVENOUS
  Filled 2011-07-03 (×5): qty 10

## 2011-07-03 MED ORDER — SODIUM CHLORIDE 0.9 % IJ SOLN
3.0000 mL | Freq: Two times a day (BID) | INTRAMUSCULAR | Status: DC
  Administered 2011-07-03 – 2011-07-04 (×2): 3 mL via INTRAVENOUS
  Filled 2011-07-03 (×2): qty 3

## 2011-07-03 MED ORDER — BIOTENE DRY MOUTH MT LIQD
15.0000 mL | Freq: Two times a day (BID) | OROMUCOSAL | Status: DC
  Administered 2011-07-03 – 2011-07-04 (×4): 15 mL via OROMUCOSAL

## 2011-07-03 MED ORDER — ASPIRIN 81 MG PO CHEW
81.0000 mg | CHEWABLE_TABLET | Freq: Every day | ORAL | Status: DC
  Administered 2011-07-03 – 2011-07-04 (×2): 81 mg via ORAL
  Filled 2011-07-03 (×2): qty 1

## 2011-07-03 MED ORDER — MUPIROCIN 2 % EX OINT: 1.0000 "application " | TOPICAL_OINTMENT | Freq: Two times a day (BID) | CUTANEOUS | Status: DC

## 2011-07-03 MED ORDER — CHLORHEXIDINE GLUCONATE CLOTH 2 % EX PADS: 6.0000 | MEDICATED_PAD | Freq: Every day | CUTANEOUS | Status: DC

## 2011-07-03 MED ORDER — VANCOMYCIN HCL IN DEXTROSE 1-5 GM/200ML-% IV SOLN: 1000.0000 mg | INTRAVENOUS | Status: DC

## 2011-07-03 MED ORDER — PREDNISONE 5 MG PO TABS
7.5000 mg | ORAL_TABLET | Freq: Every day | ORAL | Status: DC
  Administered 2011-07-04: 7.5 mg via ORAL
  Filled 2011-07-03: qty 2

## 2011-07-03 NOTE — Progress Notes (Signed)
UR Chart Review Completed  

## 2011-07-03 NOTE — Consult Note (Signed)
Reason for Consult: Chronic renal failure Referring Physician: Dr. Warrick Parisian is an 61 y.o. male.  HPI: He is a patient with long-standing history of diabetes chronic renal failure recently and discharged from [facility] reversal hospital after he was admitted to MI. During his stay patient renal function gets worse to the point where he would require dialysis. Patient patient received dialysis for 3 days and when he was discharged patient was sent to be followed as an outpatient. Patient because of her severe nausea and vomiting was brought to the emergency room where he has possible ileus. Presently patient is feeling better. He denies any difficulty breathing, orthopnea, or paroxysmal nocturnal dyspnea.  Past Medical History  Diagnosis Date  . Coronary artery disease   . MI, acute, non ST segment elevation December 2012    [facility]  . Diabetes mellitus   . Ischemic cardiomyopathy   . End stage renal disease on dialysis December 2012    Started dialysis  . Sarcoidosis   . Gout   . Renal cell carcinoma 2008  . Hypertension   . Blood loss anemia December 2012    Status post CABG. Transfused 2 units blood  . Acute respiratory failure December 2012    Secondary to MI  . Cardiogenic shock December 2012  . Depression     Past Surgical History  Procedure Date  . Nephrectomy 2008    On the left  . Double-j ureteral stent placed   . Av fistula placement, radiocephalic   . Coronary artery bypass graft December 2012    Two-vessel, [facility].    History reviewed. No pertinent family history.  Social History:  reports that he has never smoked. He has never used smokeless tobacco. He reports that he does not drink alcohol. His drug history not on file.  Allergies:  Allergies  Allergen Reactions  . Hydrocodone Nausea Only  . Sudafed (Pseudoephedrine)     Medications: I have reviewed the patient's current medications.  Results for orders placed during  the hospital encounter of 07/02/11 (from the past 48 hour(s))  POCT I-STAT TROPONIN I     Status: Abnormal   Collection Time   07/02/11 10:21 AM      Component Value Range Comment   Troponin i, poc 0.15 (*) 0.00 - 0.08 (ng/mL)    Comment NOTIFIED PHYSICIAN      Comment 3            CBC     Status: Abnormal   Collection Time   07/02/11 10:28 AM      Component Value Range Comment   WBC 9.5  4.0 - 10.5 (K/uL)    RBC 3.92 (*) 4.22 - 5.81 (MIL/uL)    Hemoglobin 11.1 (*) 13.0 - 17.0 (g/dL)    HCT 14.7 (*) 82.9 - 52.0 (%)    MCV 90.8  78.0 - 100.0 (fL)    MCH 28.3  26.0 - 34.0 (pg)    MCHC 31.2  30.0 - 36.0 (g/dL)    RDW 56.2 (*) 13.0 - 15.5 (%)    Platelets 312  150 - 400 (K/uL)   DIFFERENTIAL     Status: Abnormal   Collection Time   07/02/11 10:28 AM      Component Value Range Comment   Neutrophils Relative 82 (*) 43 - 77 (%)    Neutro Abs 7.8 (*) 1.7 - 7.7 (K/uL)    Lymphocytes Relative 7 (*) 12 - 46 (%)  Lymphs Abs 0.6 (*) 0.7 - 4.0 (K/uL)    Monocytes Relative 9  3 - 12 (%)    Monocytes Absolute 0.9  0.1 - 1.0 (K/uL)    Eosinophils Relative 2  0 - 5 (%)    Eosinophils Absolute 0.2  0.0 - 0.7 (K/uL)    Basophils Relative 0  0 - 1 (%)    Basophils Absolute 0.0  0.0 - 0.1 (K/uL)   TROPONIN I     Status: Normal   Collection Time   07/02/11 10:28 AM      Component Value Range Comment   Troponin I <0.30  <0.30 (ng/mL)   COMPREHENSIVE METABOLIC PANEL     Status: Abnormal   Collection Time   07/02/11 10:29 AM      Component Value Range Comment   Sodium 147 (*) 135 - 145 (mEq/L)    Potassium 3.9  3.5 - 5.1 (mEq/L)    Chloride 102  96 - 112 (mEq/L)    CO2 28  19 - 32 (mEq/L)    Glucose, Bld 122 (*) 70 - 99 (mg/dL)    BUN 23  6 - 23 (mg/dL)    Creatinine, Ser 1.61 (*) 0.50 - 1.35 (mg/dL)    Calcium 9.3  8.4 - 10.5 (mg/dL)    Total Protein 6.0  6.0 - 8.3 (g/dL)    Albumin 3.0 (*) 3.5 - 5.2 (g/dL)    AST 17  0 - 37 (U/L)    ALT 13  0 - 53 (U/L)    Alkaline Phosphatase 88  39 - 117  (U/L)    Total Bilirubin 0.6  0.3 - 1.2 (mg/dL)    GFR calc non Af Amer 13 (*) >90 (mL/min)    GFR calc Af Amer 15 (*) >90 (mL/min)   LIPASE, BLOOD     Status: Normal   Collection Time   07/02/11 10:29 AM      Component Value Range Comment   Lipase 52  11 - 59 (U/L)   CARDIAC PANEL(CRET KIN+CKTOT+MB+TROPI)     Status: Abnormal   Collection Time   07/02/11  7:13 PM      Component Value Range Comment   Total CK 66  7 - 232 (U/L)    CK, MB 5.3 (*) 0.3 - 4.0 (ng/mL)    Troponin I <0.30  <0.30 (ng/mL)    Relative Index RELATIVE INDEX IS INVALID  0.0 - 2.5    PRO B NATRIURETIC PEPTIDE     Status: Abnormal   Collection Time   07/02/11  7:13 PM      Component Value Range Comment   Pro B Natriuretic peptide (BNP) 32482.0 (*) 0 - 125 (pg/mL)   GLUCOSE, CAPILLARY     Status: Abnormal   Collection Time   07/02/11  9:18 PM      Component Value Range Comment   Glucose-Capillary 105 (*) 70 - 99 (mg/dL)   GLUCOSE, CAPILLARY     Status: Abnormal   Collection Time   07/03/11 12:41 AM      Component Value Range Comment   Glucose-Capillary 144 (*) 70 - 99 (mg/dL)   CARDIAC PANEL(CRET KIN+CKTOT+MB+TROPI)     Status: Abnormal   Collection Time   07/03/11  1:22 AM      Component Value Range Comment   Total CK 54  7 - 232 (U/L)    CK, MB 5.0 (*) 0.3 - 4.0 (ng/mL)    Troponin I <0.30  <0.30 (ng/mL)  Relative Index RELATIVE INDEX IS INVALID  0.0 - 2.5    MRSA PCR SCREENING     Status: Normal   Collection Time   07/03/11  3:33 AM      Component Value Range Comment   MRSA by PCR NEGATIVE  NEGATIVE    GLUCOSE, CAPILLARY     Status: Abnormal   Collection Time   07/03/11  4:34 AM      Component Value Range Comment   Glucose-Capillary 154 (*) 70 - 99 (mg/dL)   COMPREHENSIVE METABOLIC PANEL     Status: Abnormal   Collection Time   07/03/11  6:14 AM      Component Value Range Comment   Sodium 143  135 - 145 (mEq/L)    Potassium 4.0  3.5 - 5.1 (mEq/L)    Chloride 108  96 - 112 (mEq/L)    CO2 27  19 - 32  (mEq/L)    Glucose, Bld 152 (*) 70 - 99 (mg/dL)    BUN 25 (*) 6 - 23 (mg/dL)    Creatinine, Ser 1.61 (*) 0.50 - 1.35 (mg/dL)    Calcium 8.0 (*) 8.4 - 10.5 (mg/dL)    Total Protein 5.5 (*) 6.0 - 8.3 (g/dL)    Albumin 2.4 (*) 3.5 - 5.2 (g/dL)    AST 15  0 - 37 (U/L)    ALT 9  0 - 53 (U/L)    Alkaline Phosphatase 71  39 - 117 (U/L)    Total Bilirubin 0.3  0.3 - 1.2 (mg/dL)    GFR calc non Af Amer 13 (*) >90 (mL/min)    GFR calc Af Amer 15 (*) >90 (mL/min)   CBC     Status: Abnormal   Collection Time   07/03/11  6:14 AM      Component Value Range Comment   WBC 8.9  4.0 - 10.5 (K/uL)    RBC 3.19 (*) 4.22 - 5.81 (MIL/uL)    Hemoglobin 8.9 (*) 13.0 - 17.0 (g/dL)    HCT 09.6 (*) 04.5 - 52.0 (%)    MCV 91.2  78.0 - 100.0 (fL)    MCH 27.9  26.0 - 34.0 (pg)    MCHC 30.6  30.0 - 36.0 (g/dL)    RDW 40.9 (*) 81.1 - 15.5 (%)    Platelets 255  150 - 400 (K/uL)   CARDIAC PANEL(CRET KIN+CKTOT+MB+TROPI)     Status: Abnormal   Collection Time   07/03/11  6:14 AM      Component Value Range Comment   Total CK 53  7 - 232 (U/L)    CK, MB 4.6 (*) 0.3 - 4.0 (ng/mL)    Troponin I <0.30  <0.30 (ng/mL)    Relative Index RELATIVE INDEX IS INVALID  0.0 - 2.5    MAGNESIUM     Status: Normal   Collection Time   07/03/11  6:14 AM      Component Value Range Comment   Magnesium 1.8  1.5 - 2.5 (mg/dL)   PRO B NATRIURETIC PEPTIDE     Status: Abnormal   Collection Time   07/03/11  6:24 AM      Component Value Range Comment   Pro B Natriuretic peptide (BNP) 29888.0 (*) 0 - 125 (pg/mL)   GLUCOSE, CAPILLARY     Status: Abnormal   Collection Time   07/03/11  7:40 AM      Component Value Range Comment   Glucose-Capillary 131 (*) 70 - 99 (mg/dL)    Comment  1 Notify RN       Dg Chest 1 View  07/02/2011  *RADIOLOGY REPORT*  Clinical Data: Cough and shortness of breath.  Rule out pneumonia. Lateral view requested by radiologist.  CHEST - 1 VIEW  Comparison: Portable chest x-ray 07/02/2011  Findings: When compared to the  frontal portable chest x-ray from earlier today, the findings on the frontal portable chest x-ray appear to be linear opacities most consistent with subsegmental atelectasis.  In addition, there is minimal blunting of the left costophrenic sulcus, indicative of a small left-sided pleural effusion.  The patient is status post median sternotomy for CABG. A perm catheter is in place with tip projecting in the expected location of the superior cavoatrial junction.  IMPRESSION:  1.  Together with findings from the portable view of the chest from earlier today, the appearance is most consistent with a small left- sided pleural effusion with adjacent subsegmental atelectasis in the left lower lobe.  No definite focal airspace consolidation is appreciated to suggest pneumonia at this time.  Original Report Authenticated By: Florencia Reasons, M.D.   Dg Abd 1 View  07/02/2011  *RADIOLOGY REPORT*  Clinical Data: Vomiting.  ABDOMEN - 1 VIEW  Comparison: None.  Findings: There are gas-filled loops of small bowel in the left abdomen.  There is gas within the colon.  Multiple surgical clips throughout the abdomen.  Limited evaluation for free air.  IMPRESSION: Air-filled loops of small bowel in the left abdomen with gas in the colon.  Differential diagnosis includes an ileus versus early small bowel obstruction.  Original Report Authenticated By: Richarda Overlie, M.D.   Dg Chest Portable 1 View  07/02/2011  *RADIOLOGY REPORT*  Clinical Data: Vomiting.  PORTABLE CHEST - 1 VIEW  Comparison: None.  Findings: Right internal jugular central venous catheter tip projects over the lower SVC.  Prior CABG noted.  There is retrodiaphragmatic opacity on the left which is not entirely specific, and could reflect atelectasis or pneumonia.  Lateral radiography, if feasible, would be recommended.  Linear subsegmental atelectasis noted in the left midlung. Low lung volumes are present, causing crowding of the pulmonary vasculature. Heart given the  low lung volumes, heart size is within normal limits.  IMPRESSION:  1.  Retrodiaphragmatic opacity on the left, not entirely specific. This could be from atelectasis, a small pleural effusion, or pneumonia.  If feasible, lateral chest radiography may be helpful in differentiation.  2.  Subsegmental atelectasis in the left midlung.  Original Report Authenticated By: Dellia Cloud, M.D.    Review of Systems  Constitutional: Positive for weight loss.  Respiratory: Negative for shortness of breath.   Cardiovascular: Negative for chest pain, leg swelling and PND.  Gastrointestinal: Positive for nausea, vomiting and abdominal pain. Negative for diarrhea.  Neurological: Positive for weakness.   Blood pressure 122/69, pulse 66, temperature 97.4 F (36.3 C), temperature source Oral, resp. rate 18, height 5\' 9"  (1.753 m), weight 67 kg (147 lb 11.3 oz), SpO2 97.00%. Physical Exam  Constitutional: He is oriented to person, place, and time. He appears distressed.  Eyes: Left eye exhibits discharge.  Neck: No JVD present.  Cardiovascular: Normal rate, regular rhythm and normal heart sounds.   No murmur heard. Respiratory: Breath sounds normal. No respiratory distress. He has no wheezes.  GI: There is tenderness. There is no rebound.  Musculoskeletal: He exhibits no edema.  Neurological: He is alert and oriented to person, place, and time.  Skin: Skin is dry.  Assessment/Plan:  problem #1 renal failure her chronic patient is status post hemodialysis on Friday his BUN and creatinine at this moment seems to be stable. He doesn't have any sign of fluid overload. Patient presently is none oliguric. Problem #2 history of hypertension his blood pressure is controlled very well Problem #3 history of coronary artery disease status post CABG he is a symptomatic. Problem #4 history of diabetes Problem #5 history of anemia possibly anemia of chronic disease as this moment iron deficiency anemia also and  to be entertained as a possibility. Problem #6 history of nausea vomiting this is thought to be secondary to ileus presently seems to be feeling better. 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 Recommendation: We'll DC IV fluid Will give him Lasix 100 mg IV twice a day We'll check his CBC and basic metabolic panel in the morning. If his renal function remains stable we'll hold dialysis otherwise will consider dialysis in the morning.   Nasire Reali S 07/03/2011, 11:38 AM

## 2011-07-03 NOTE — Progress Notes (Signed)
ANTIBIOTIC CONSULT NOTE - INITIAL  Pharmacy Consult for Vancomycin Indication:  Cellulitis of chest wall  Allergies  Allergen Reactions  . Hydrocodone Nausea Only  . Sudafed (Pseudoephedrine)     Patient Measurements: Height: 5\' 9"  (175.3 cm) Weight: 147 lb 11.3 oz (67 kg) IBW/kg (Calculated) : 70.7  Adjusted Body Weight: n/a  Vital Signs: Temp: 97.4 F (36.3 C) (01/08 0628) Temp src: Oral (01/08 0628) BP: 122/69 mmHg (01/08 0628) Pulse Rate: 66  (01/08 0628) Intake/Output from previous day: 01/07 0701 - 01/08 0700 In: 522 [I.V.:520; IV Piggyback:2] Out: -  Intake/Output from this shift: Total I/O In: 343.6 [P.O.:120; I.V.:223.6] Out: -   Labs:  Basename 07/03/11 0614 07/02/11 1029 07/02/11 1028  WBC 8.9 -- 9.5  HGB 8.9* -- 11.1*  PLT 255 -- 312  LABCREA -- -- --  CREATININE 4.40* 4.52* --   Estimated Creatinine Clearance: 16.5 ml/min (by C-G formula based on Cr of 4.4).    Microbiology: Recent Results (from the past 720 hour(s))  MRSA PCR SCREENING     Status: Normal   Collection Time   07/03/11  3:33 AM      Component Value Range Status Comment   MRSA by PCR NEGATIVE  NEGATIVE  Final     Medical History: Past Medical History  Diagnosis Date  . Coronary artery disease   . MI, acute, non ST segment elevation December 2012    [facility]  . Diabetes mellitus   . Ischemic cardiomyopathy   . End stage renal disease on dialysis December 2012    Started dialysis  . Sarcoidosis   . Gout   . Renal cell carcinoma 2008  . Hypertension   . Blood loss anemia December 2012    Status post CABG. Transfused 2 units blood  . Acute respiratory failure December 2012    Secondary to MI  . Cardiogenic shock December 2012  . Depression     Medications:  Scheduled:    . antiseptic oral rinse  15 mL Mouth Rinse BID  . aspirin  81 mg Oral Daily  . enoxaparin  30 mg Subcutaneous Q24H  . furosemide  100 mg Intravenous BID  . insulin aspart  0-9 Units  Subcutaneous Q4H  . metoprolol tartrate  12.5 mg Oral BID  . morphine  2 mg Intravenous Once  . ondansetron (ZOFRAN) IV  4 mg Intravenous Q6H  . pantoprazole (PROTONIX) IV  40 mg Intravenous Q24H  . predniSONE  7.5 mg Oral Q breakfast  . promethazine  12.5 mg Intravenous Once  . vancomycin  1,000 mg Intravenous Once  . DISCONTD: Chlorhexidine Gluconate Cloth  6 each Topical Q0600  . DISCONTD: hydrocortisone sod succinate (SOLU-CORTEF) injection  25 mg Intravenous Daily  . DISCONTD: metoprolol  5 mg Intravenous Q6H  . DISCONTD: mupirocin ointment  1 application Nasal BID  . DISCONTD: vancomycin  1,000 mg Intravenous Q48H   Assessment: Received 1000 mg of Vancomycin on 07-02-11. Last HD was on 06-29-11. No HD scheduled for today.  Goal of Therapy:  Trough level > 15 mcg/ml  Plan:   No Vancomycin today. Follow up HD schedule and dose accordingly.  Gilman Buttner, [place] J 07/03/2011,11:58 AM

## 2011-07-03 NOTE — Progress Notes (Signed)
Pt. Was started on dialysis at [facility] where he received 3 treatments. I attempted to contact [facility] for any records but was unable to make contact. Pt. Will be treated as a new start with labs ordered at [facility].

## 2011-07-03 NOTE — Progress Notes (Signed)
Subjective: The patient has no complaints of chest pain or chest congestion. He says that his nausea and vomiting have resolved. He has no complaints of abdominal pain. He was to try to eat.  Objective: Vital signs in last 24 hours: Filed Vitals:   07/02/11 2253 07/02/11 2347 07/03/11 0039 07/03/11 0628  BP: 124/67 108/70 128/73 122/69  Pulse: 89 85 80 66  Temp:  98.8 F (37.1 C) 97.1 F (36.2 C) 97.4 F (36.3 C)  TempSrc:  Oral Oral Oral  Resp: 20 20 20 18   Height:   5\' 9"  (1.753 m)   Weight:   67 kg (147 lb 11.3 oz)   SpO2: 96% 94% 97%     Intake/Output Summary (Last 24 hours) at 07/03/11 1130 Last data filed at 07/03/11 1000  Gross per 24 hour  Intake 865.58 ml  Output      0 ml  Net 865.58 ml    Weight change:   Physical exam: Lungs: Decreased breath sounds in the bases. Clear anteriorly. Breathing is nonlabored. Heart: S1, S2, with a soft systolic murmur. Chest wall: Sternotomy scar with mild surrounding erythema, less than yesterday. No active drainage. Nontender. Abdomen: Positive bowel sounds, soft, no obvious distention, nontender. Extremities: No pedal edema.  Lab Results: Basic Metabolic Panel:  Basename 07/03/11 0614 07/02/11 1029  NA 143 147*  K 4.0 3.9  CL 108 102  CO2 27 28  GLUCOSE 152* 122*  BUN 25* 23  CREATININE 4.40* 4.52*  CALCIUM 8.0* 9.3  MG 1.8 --  PHOS -- --   Liver Function Tests:  [facility] 07/03/11 0614 07/02/11 1029  AST 15 17  ALT 9 13  ALKPHOS 71 88  BILITOT 0.3 0.6  PROT 5.5* 6.0  ALBUMIN 2.4* 3.0*    Basename 07/02/11 1029  LIPASE 52  AMYLASE --   No results found for this basename: AMMONIA:2 in the last 72 hours CBC:  Basename 07/03/11 0614 07/02/11 1028  WBC 8.9 9.5  NEUTROABS -- 7.8*  HGB 8.9* 11.1*  HCT 29.1* 35.6*  MCV 91.2 90.8  PLT 255 312   Cardiac Enzymes:  Basename 07/03/11 0614 07/03/11 0122 07/02/11 1913  CKTOTAL 53 54 66  CKMB 4.6* 5.0* 5.3*  CKMBINDEX -- -- --  TROPONINI <0.30 <0.30 <0.30    BNP:  Basename 07/03/11 0624 07/02/11 1913  PROBNP 16109.6* 32482.0*   D-Dimer: No results found for this basename: DDIMER:2 in the last 72 hours CBG:  Basename 07/03/11 0740 07/03/11 0434 07/03/11 0041 07/02/11 2118  GLUCAP 131* 154* 144* 105*   Hemoglobin A1C: No results found for this basename: HGBA1C in the last 72 hours Fasting Lipid Panel: No results found for this basename: CHOL,HDL,LDLCALC,TRIG,CHOLHDL,LDLDIRECT in the last 72 hours Thyroid Function Tests: No results found for this basename: TSH,T4TOTAL,FREET4,T3FREE,THYROIDAB in the last 72 hours Anemia Panel: No results found for this basename: VITAMINB12,FOLATE,FERRITIN,TIBC,IRON,RETICCTPCT in the last 72 hours Coagulation: No results found for this basename: LABPROT:2,INR:2 in the last 72 hours Urine Drug Screen: Drugs of Abuse  No results found for this basename: labopia, cocainscrnur, labbenz, amphetmu, thcu, labbarb    Alcohol Level: No results found for this basename: ETH:2 in the last 72 hours    Micro: Recent Results (from the past 240 hour(s))  MRSA PCR SCREENING     Status: Normal   Collection Time   07/03/11  3:33 AM      Component Value Range Status Comment   MRSA by PCR NEGATIVE  NEGATIVE  Final  Studies/Results: Dg Chest 1 View  07/02/2011  *RADIOLOGY REPORT*  Clinical Data: Cough and shortness of breath.  Rule out pneumonia. Lateral view requested by radiologist.  CHEST - 1 VIEW  Comparison: Portable chest x-ray 07/02/2011  Findings: When compared to the frontal portable chest x-ray from earlier today, the findings on the frontal portable chest x-ray appear to be linear opacities most consistent with subsegmental atelectasis.  In addition, there is minimal blunting of the left costophrenic sulcus, indicative of a small left-sided pleural effusion.  The patient is status post median sternotomy for CABG. A perm catheter is in place with tip projecting in the expected location of the superior  cavoatrial junction.  IMPRESSION:  1.  Together with findings from the portable view of the chest from earlier today, the appearance is most consistent with a small left- sided pleural effusion with adjacent subsegmental atelectasis in the left lower lobe.  No definite focal airspace consolidation is appreciated to suggest pneumonia at this time.  Original Report Authenticated By: Florencia Reasons, M.D.   Dg Abd 1 View  07/02/2011  *RADIOLOGY REPORT*  Clinical Data: Vomiting.  ABDOMEN - 1 VIEW  Comparison: None.  Findings: There are gas-filled loops of small bowel in the left abdomen.  There is gas within the colon.  Multiple surgical clips throughout the abdomen.  Limited evaluation for free air.  IMPRESSION: Air-filled loops of small bowel in the left abdomen with gas in the colon.  Differential diagnosis includes an ileus versus early small bowel obstruction.  Original Report Authenticated By: Richarda Overlie, M.D.   Dg Chest Portable 1 View  07/02/2011  *RADIOLOGY REPORT*  Clinical Data: Vomiting.  PORTABLE CHEST - 1 VIEW  Comparison: None.  Findings: Right internal jugular central venous catheter tip projects over the lower SVC.  Prior CABG noted.  There is retrodiaphragmatic opacity on the left which is not entirely specific, and could reflect atelectasis or pneumonia.  Lateral radiography, if feasible, would be recommended.  Linear subsegmental atelectasis noted in the left midlung. Low lung volumes are present, causing crowding of the pulmonary vasculature. Heart given the low lung volumes, heart size is within normal limits.  IMPRESSION:  1.  Retrodiaphragmatic opacity on the left, not entirely specific. This could be from atelectasis, a small pleural effusion, or pneumonia.  If feasible, lateral chest radiography may be helpful in differentiation.  2.  Subsegmental atelectasis in the left midlung.  Original Report Authenticated By: Dellia Cloud, M.D.    Medications: I have reviewed the patient's  current medications.  Assessment: Active Problems:  Nausea and vomiting  Ileus  End stage renal disease  Hypernatremia  Diabetes mellitus type 2 in nonobese  Coronary artery disease  Hypertension  Anemia  Cellulitis of chest wall  Prolonged Q-T interval on ECG  1. Nausea and vomiting, likely secondary to ileus. His liver transaminases and lipase are within normal limits. His abdomen is less distended. His bowel sounds are active. A full liquid diet will be tried today.  Hypernatremia, secondary to volume depletion/dehydration. Resolved with gentle IV fluids.  End-stage renal disease. His BUN and creatinine are stable. Nephrology has been consulted.  Coronary artery disease/status post CABG/ischemic cardiomyopathy. His cardiac enzymes are essentially negative. His pro BNP is quite elevated, however, this could be a chronic elevation from the recent CABG and from end-stage renal disease. Clinically, he does not appear to have decompensated congestive heart failure. His ins and outs are positive approximately 900 cc do to the gentle  IV fluids. Next  Prolonged QT interval on EKG. His magnesium level is within normal limits.  Hypertension. His blood pressure is currently controlled. He is on IV metoprolol. This will be changed to by mouth if he can tolerate full liquid diet.  Type 2 diabetes mellitus. Currently stable and controlled on sliding scale NovoLog only.  Cellulitis of chest wall/status post sternotomy. There is less erythema on vancomycin.  Normocytic anemia. His hemoglobin has decreased with IV fluids only to hemodilution. He has residual anemia from blood loss during the previous hospitalization and from end-stage renal disease.  Sarcoidosis. Currently on IV hydrocortisone. This will be discontinued in favor of prednisone if he can tolerate orally.   Plan:  1. Decreased IV fluids to 50 cc an hour. Nephrology has been consulted. We will await to see if dialysis will be  done today.  Will advance his diet to a full liquid diet. We'll change his metoprolol to by mouth.  Continue supportive treatment. If he is able to tolerate a full lipid diet, we will advance as tolerated. Consider discharge to home tomorrow if no signs of nausea and vomiting or persistent ileus.   LOS: 1 day   Marv Alfrey 07/03/2011, 11:30 AM

## 2011-07-04 ENCOUNTER — Other Ambulatory Visit: Payer: Self-pay

## 2011-07-04 ENCOUNTER — Inpatient Hospital Stay (HOSPITAL_COMMUNITY): Payer: Medicare Other

## 2011-07-04 LAB — CBC
Hemoglobin: 8.5 g/dL — ABNORMAL LOW (ref 13.0–17.0)
MCH: 27.9 pg (ref 26.0–34.0)
MCHC: 30.8 g/dL (ref 30.0–36.0)
MCV: 90.5 fL (ref 78.0–100.0)
RBC: 3.05 MIL/uL — ABNORMAL LOW (ref 4.22–5.81)

## 2011-07-04 LAB — GLUCOSE, CAPILLARY

## 2011-07-04 LAB — BASIC METABOLIC PANEL
BUN: 26 mg/dL — ABNORMAL HIGH (ref 6–23)
CO2: 28 mEq/L (ref 19–32)
GFR calc non Af Amer: 13 mL/min — ABNORMAL LOW (ref >90)
Glucose, Bld: 108 mg/dL — ABNORMAL HIGH (ref 70–99)
Potassium: 3.5 mEq/L (ref 3.5–5.1)
Sodium: 144 mEq/L (ref 135–145)

## 2011-07-04 LAB — CARDIAC PANEL(CRET KIN+CKTOT+MB+TROPI)
CK, MB: 3.7 ng/mL (ref 0.3–4.0)
Relative Index: INVALID (ref 0.0–2.5)
Total CK: 53 U/L (ref 7–232)
Troponin I: <0.3 ng/mL (ref <0.30)

## 2011-07-04 LAB — HEPATITIS B SURFACE ANTIBODY,QUALITATIVE: Hep B S Ab: NEGATIVE

## 2011-07-04 LAB — PHOSPHORUS: Phosphorus: 3.8 mg/dL (ref 2.3–4.6)

## 2011-07-04 LAB — HEPATITIS B SURFACE ANTIGEN: Hepatitis B Surface Ag: NEGATIVE

## 2011-07-04 LAB — HEPATITIS B CORE ANTIBODY, TOTAL: Hep B Core Total Ab: NEGATIVE

## 2011-07-04 MED ORDER — ONDANSETRON HCL 4 MG PO TABS: 4.0000 mg | ORAL_TABLET | Freq: Four times a day (QID) | ORAL | Status: AC | PRN

## 2011-07-04 MED ORDER — FUROSEMIDE 20 MG PO TABS: 80.0000 mg | ORAL_TABLET | Freq: Two times a day (BID) | ORAL | Status: DC

## 2011-07-04 MED ORDER — POTASSIUM CHLORIDE CRYS ER 20 MEQ PO TBCR: 20.0000 meq | EXTENDED_RELEASE_TABLET | Freq: Every day | ORAL | Status: AC

## 2011-07-04 MED ORDER — PANTOPRAZOLE SODIUM 40 MG PO TBEC
40.0000 mg | DELAYED_RELEASE_TABLET | Freq: Every day | ORAL | Status: DC
  Administered 2011-07-04: 40 mg via ORAL
  Filled 2011-07-04: qty 1

## 2011-07-04 MED ORDER — POTASSIUM CHLORIDE CRYS ER 20 MEQ PO TBCR
40.0000 meq | EXTENDED_RELEASE_TABLET | Freq: Once | ORAL | Status: AC
  Administered 2011-07-04: 40 meq via ORAL
  Filled 2011-07-04: qty 2

## 2011-07-04 NOTE — Progress Notes (Signed)
Pt ambulated in hallway with assistance from standard walker and NT. Pt tolerated well. Pt walked approximately 120 ft. No complications noted.

## 2011-07-04 NOTE — Progress Notes (Signed)
The patient has no complaints of nausea or vomiting and is receiving Zofran as scheduled. However, patient states that he is experiencing a "tenderness" in his left shoulder area that he feels may be related to his incision.  V/S 109/61, 64, 20, 98.0, 98%.

## 2011-07-04 NOTE — ED Provider Notes (Signed)
Medical screening examination/treatment/procedure(s) were conducted as a shared visit with non-physician practitioner(s) and myself.  I personally evaluated the patient during the encounter   Deshunda Thackston W Yehonatan Grandison, MD 07/04/11 1516 

## 2011-07-04 NOTE — Progress Notes (Signed)
The patient is receiving Protonix by the intravenous route.  Based on criteria approved by the Pharmacy and Therapeutics Committee and the Medical Executive Committee, the medication is being converted to the equivalent oral dose form.  These criteria include: -No Active GI bleeding -Able to tolerate diet of full liquids (or better) or tube feeding -Able to tolerate other medications by the oral or enteral route  If you have any questions about this conversion, please contact the Pharmacy Department (ext [phone]).  Thank you.  S. Kyleah Pensabene, PharmD  

## 2011-07-04 NOTE — Progress Notes (Signed)
Juan Rowe  MRN: [id number]  DOB/AGE: October 02, 1950 61 y.o.  Primary Care Physician:No primary provider on file.  Admit date: 07/02/2011  Chief Complaint:  Chief Complaint  Patient presents with  . Nausea  . Emesis  . Anxiety  . Headache    S-Pt presented on  07/02/2011 with  Chief Complaint  Patient presents with  . Nausea  . Emesis  . Anxiety  . Headache  .    Pt today feels better.      Marland Kitchen antiseptic oral rinse  15 mL Mouth Rinse BID  . aspirin  81 mg Oral Daily  . enoxaparin  30 mg Subcutaneous Q24H  . furosemide  100 mg Intravenous BID  . insulin aspart  0-9 Units Subcutaneous Q4H  . metoprolol tartrate  12.5 mg Oral BID  . ondansetron (ZOFRAN) IV  4 mg Intravenous Q6H  . pantoprazole  40 mg Oral QAC breakfast  . predniSONE  7.5 mg Oral Q breakfast  . sodium chloride  3 mL Intravenous Q12H  . DISCONTD: pantoprazole (PROTONIX) IV  40 mg Intravenous Q24H         MVH:QIONG from the symptoms mentioned above,there are no other symptoms referable to all systems reviewed.  Physical Exam: Vital signs in last 24 hours: Temp:  [98 F (36.7 C)-98.5 F (36.9 C)] 98 F (36.7 C) (01/09 0605) Pulse Rate:  [64-85] 64  (01/09 0605) Resp:  [20] 20  (01/09 0605) BP: (94-122)/(56-64) 109/61 mmHg (01/09 0605) SpO2:  [90 %-98 %] 90 % (01/09 0605) Weight:  [149 lb 4.8 oz (67.722 kg)] 149 lb 4.8 oz (67.722 kg) (01/09 1400) Weight change:  Last BM Date: 07/02/11  Intake/Output from previous day: 01/08 0701 - 01/09 0700 In: 343.6 [P.O.:120; I.V.:223.6] Out: -  Total I/O In: 65 [I.V.:3; IV Piggyback:62] Out: -    Physical Exam: General- pt is awake,alert, oriented to time place and person Resp- No acute REsp distress, CTA B/L NO Rhonchi CVS- S1S2 regular in rate and rhythm GIT- BS+, soft, NT, ND EXT- NO LE Edema, Cyanosis   Lab Results: CBC  Basename 07/04/11 0537 07/03/11 0614  WBC 7.7 8.9  HGB 8.5* 8.9*  HCT 27.6* 29.1*  PLT 244 255     BMET  Basename 07/04/11 0537 07/03/11 0614  NA 144 143  K 3.5 4.0  CL 108 108  CO2 28 27  GLUCOSE 108* 152*  BUN 26* 25*  CREATININE 4.43* 4.40*  CALCIUM 8.1* 8.0*    MICRO Recent Results (from the past 240 hour(s))  MRSA PCR SCREENING     Status: Normal   Collection Time   07/03/11  3:33 AM      Component Value Range Status Comment   MRSA by PCR NEGATIVE  NEGATIVE  Final       Lab Results  Component Value Date   CALCIUM 8.1* 07/04/2011   PHOS 3.8 07/04/2011       Impression: 1)Renal  CKD Stage 5 - near ESRD                Pt recently had  Dialysis                Pt last dialysis during his stay at [facility] hospital-Last saturday                Pt does Not require dialysis today                Pt will probably need it soon .  Pt probably had HD during admission secondary to Cardiac/ volume issues.                Had extensive discussion with pt that he very near to needing dialysis on long term basis. Pt does follow up regularly at [facility] with Pa/ NP   2)HTN                   Target Organ damage                    CKD                   CAD-s/p CABG                   CHF                                   Medication-                On Diuretics-Lasix                 On Beta blockers   3)Anemia HGb not at goal (9--11)  4)CKD Mineral-Bone Disorder-will w/up as outpt. PTH not avail Secondary Hyperparathyroidism w/u pending  Phosphorus will check Vitamin 25-OH will check  5)GI- admitted with Ileus  6)FEN  Normokalemic NOrmonatremic Euvolemic   7)Acid base Co2 at goal     Plan:  Agree with current treatment and plan Will suggest to change lasix to 80 mg po BID Pt needs to have close follow u;p with nephrology Pt needs close follow up with pcp Will decide as outpt regarding Pc catheter if pt needs with in 2-3 weeks will keep it in otherwise will plan to d/c it      Juan Rowe S 07/04/2011, 4:09 PM

## 2011-07-04 NOTE — Progress Notes (Signed)
Pt discharged home today per Dr. Sherrie Mustache. Pt's IV site d/c'd and WNL. Pt's VS stable at this time. Pt provided with home medication list, discharge instructions and prescriptions. Pt verbalized understanding. Pt made aware of F/U appointments in place. Pt verbalized understanding. Pt left floor via WC in stable condition accompanied by NT.

## 2011-07-05 NOTE — Discharge Summary (Signed)
Physician Discharge Summary  KVION SHAPLEY MRN: [id number] DOB/AGE: [birth year] 63 y.o.  PCP: No primary provider on file.   Admit date: 07/02/2011 Discharge date: 07/04/2011  Discharge Diagnoses:  1. Nausea and vomiting, secondary to ileus. Possible contribution from side effects from Augmentin. 2. End-stage renal disease. 3. Hypernatremia, secondary to volume depletion. 4. Anemia in the setting of recent blood loss anemia from CABG.  5. Acute myocardial infarction in December of 2012 with ischemic cardiomyopathy, status post 2 vessel coronary bypass grafting at [facility]. The patient's pro BNP ranged from 29,000-33,000 but there was no clinical evidence of congestive heart failure. 6. Cellulitis of chest wall/sternotomy scar. 7. Hypertension. 8. Type 2 diabetes mellitus. 9. Prolonged QT interval on EKG. Magnesium level was within normal limits. Cardiac enzymes were within normal limits. 10. Low TSH, but normal free T4. In   Discharge Medication List as of 07/04/2011  5:12 PM    START taking these medications   Details  furosemide (LASIX) 20 MG tablet Take 4 tablets (80 mg total) by mouth 2 (two) times daily., Starting 07/04/2011, Until Thu 07/03/12, Print    ondansetron (ZOFRAN) 4 MG tablet Take 1 tablet (4 mg total) by mouth every 6 (six) hours as needed for nausea., Starting 07/04/2011, Until Wed 07/11/11, Print    potassium chloride SA (K-DUR,KLOR-CON) 20 MEQ tablet Take 1 tablet (20 mEq total) by mouth daily., Starting 07/04/2011, Until Thu 07/03/12, Print      CONTINUE these medications which have NOT CHANGED   Details  acetaminophen (TYLENOL) 325 MG tablet Take 325 mg by mouth every 6 (six) hours as needed.  , Until Discontinued, Historical Med    allopurinol (ZYLOPRIM) 100 MG tablet Take 100 mg by mouth every Monday, Wednesday, and Friday.  , Until Discontinued, Historical Med    aspirin EC 81 MG tablet Take 81 mg by mouth daily.  , Until Discontinued, Historical  Med    calcitRIOL (ROCALTROL) 0.25 MCG capsule Take 0.25 mcg by mouth every Monday, Wednesday, and Friday.  , Until Discontinued, Historical Med    calcium acetate (PHOSLO) 667 MG capsule Take 667 mg by mouth 3 (three) times daily with meals.  , Until Discontinued, Historical Med    docusate sodium (COLACE) 100 MG capsule Take 100 mg by mouth 2 (two) times daily as needed. For constipation , Until Discontinued, Historical Med    escitalopram (LEXAPRO) 10 MG tablet Take 10 mg by mouth daily.  , Until Discontinued, Historical Med    insulin aspart (NOVOLOG) 100 UNIT/ML injection Inject 3 Units into the skin 3 (three) times daily before meals.  , Until Discontinued, Historical Med    insulin glargine (LANTUS) 100 UNIT/ML injection Inject 14 Units into the skin at bedtime.  , Until Discontinued, Historical Med    metoprolol tartrate (LOPRESSOR) 25 MG tablet Take 12.5 mg by mouth 2 (two) times daily.  , Until Discontinued, Historical Med    omeprazole (PRILOSEC) 20 MG capsule Take 20 mg by mouth daily.  , Until Discontinued, Historical Med    oxyCODONE (OXY IR/ROXICODONE) 5 MG immediate release tablet Take 5 mg by mouth every 6 (six) hours as needed. For pain , Until Discontinued, Historical Med    predniSONE (DELTASONE) 2.5 MG tablet Take 7.5 mg by mouth daily.  , Until Discontinued, Historical Med    senna (SENOKOT) 8.6 MG TABS Take 2 tablets by mouth at bedtime as needed. For constipation , Until Discontinued, Historical Med    simvastatin (  ZOCOR) 40 MG tablet Take 40 mg by mouth every evening.  , Until Discontinued, Historical Med    triamcinolone cream (KENALOG) 0.1 % Apply 1 application topically 2 (two) times daily.  , Until Discontinued, Historical Med      STOP taking these medications     amoxicillin-clavulanate (AUGMENTIN) 500-125 MG per tablet         Discharge Condition: Improved and stable.  Disposition: Home or Self Care   Consults: Dr. Kristian Covey   Significant  Diagnostic Studies: Dg Chest 1 View  07/02/2011  *RADIOLOGY REPORT*  Clinical Data: Cough and shortness of breath.  Rule out pneumonia. Lateral view requested by radiologist.  CHEST - 1 VIEW  Comparison: Portable chest x-ray 07/02/2011  Findings: When compared to the frontal portable chest x-ray from earlier today, the findings on the frontal portable chest x-ray appear to be linear opacities most consistent with subsegmental atelectasis.  In addition, there is minimal blunting of the left costophrenic sulcus, indicative of a small left-sided pleural effusion.  The patient is status post median sternotomy for CABG. A perm catheter is in place with tip projecting in the expected location of the superior cavoatrial junction.  IMPRESSION:  1.  Together with findings from the portable view of the chest from earlier today, the appearance is most consistent with a small left- sided pleural effusion with adjacent subsegmental atelectasis in the left lower lobe.  No definite focal airspace consolidation is appreciated to suggest pneumonia at this time.  Original Report Authenticated By: Florencia Reasons, M.D.   Dg Abd 1 View  07/02/2011  *RADIOLOGY REPORT*  Clinical Data: Vomiting.  ABDOMEN - 1 VIEW  Comparison: None.  Findings: There are gas-filled loops of small bowel in the left abdomen.  There is gas within the colon.  Multiple surgical clips throughout the abdomen.  Limited evaluation for free air.  IMPRESSION: Air-filled loops of small bowel in the left abdomen with gas in the colon.  Differential diagnosis includes an ileus versus early small bowel obstruction.  Original Report Authenticated By: Richarda Overlie, M.D.   Dg Chest Port 1 View  07/04/2011  *RADIOLOGY REPORT*  Clinical Data: Chest pain.  Generalized weakness.  Prior CABG.  PORTABLE CHEST - 1 VIEW 07/04/2011 0947 hours:  Comparison: Portable chest x-ray 07/02/2011.  Findings: Prior sternotomy for CABG.  Cardiac silhouette enlarged but stable.  Pulmonary  vascularity normal without evidence of pulmonary edema.  Interval development of patchy airspace opacities in the left lung base.  Stable small left pleural effusion. Worsening linear atelectasis in the inferior left upper lobe. Right lung remains clear.  Right jugular dialysis catheter tips remain in the SVC.  IMPRESSION: Stable cardiomegaly without pulmonary edema.  Developing patchy bronchopneumonia in the left lung base.  Stable small left pleural effusion.  Original Report Authenticated By: Arnell Sieving, M.D.   Dg Chest Portable 1 View  07/02/2011  *RADIOLOGY REPORT*  Clinical Data: Vomiting.  PORTABLE CHEST - 1 VIEW  Comparison: None.  Findings: Right internal jugular central venous catheter tip projects over the lower SVC.  Prior CABG noted.  There is retrodiaphragmatic opacity on the left which is not entirely specific, and could reflect atelectasis or pneumonia.  Lateral radiography, if feasible, would be recommended.  Linear subsegmental atelectasis noted in the left midlung. Low lung volumes are present, causing crowding of the pulmonary vasculature. Heart given the low lung volumes, heart size is within normal limits.  IMPRESSION:  1.  Retrodiaphragmatic opacity on the left,  not entirely specific. This could be from atelectasis, a small pleural effusion, or pneumonia.  If feasible, lateral chest radiography may be helpful in differentiation.  2.  Subsegmental atelectasis in the left midlung.  Original Report Authenticated By: Dellia Cloud, M.D.     Microbiology: Recent Results (from the past 240 hour(s))  MRSA PCR SCREENING     Status: Normal   Collection Time   07/03/11  3:33 AM      Component Value Range Status Comment   MRSA by PCR NEGATIVE  NEGATIVE  Final      Labs: Results for orders placed during the hospital encounter of 07/02/11 (from the past 48 hour(s))  GLUCOSE, CAPILLARY     Status: Abnormal   Collection Time   07/03/11  7:40 AM      Component Value Range  Comment   Glucose-Capillary 131 (*) 70 - 99 (mg/dL)    Comment 1 Notify RN     GLUCOSE, CAPILLARY     Status: Abnormal   Collection Time   07/03/11 11:24 AM      Component Value Range Comment   Glucose-Capillary 119 (*) 70 - 99 (mg/dL)    Comment 1 Notify RN     ALT     Status: Normal   Collection Time   07/03/11 12:53 PM      Component Value Range Comment   ALT 9  0 - 53 (U/L)   HEPATITIS B CORE ANTIBODY, TOTAL     Status: Normal   Collection Time   07/03/11 12:53 PM      Component Value Range Comment   Hep B Core Total Ab NEGATIVE  NEGATIVE    HEPATITIS B SURFACE ANTIGEN     Status: Normal   Collection Time   07/03/11 12:53 PM      Component Value Range Comment   Hepatitis B Surface Ag NEGATIVE  NEGATIVE    HEPATITIS B SURFACE ANTIBODY     Status: Normal   Collection Time   07/03/11 12:53 PM      Component Value Range Comment   Hep B S Ab NEGATIVE  NEGATIVE    GLUCOSE, CAPILLARY     Status: Abnormal   Collection Time   07/03/11  4:33 PM      Component Value Range Comment   Glucose-Capillary 147 (*) 70 - 99 (mg/dL)    Comment 1 Notify RN      Comment 2 Documented in Chart     GLUCOSE, CAPILLARY     Status: Abnormal   Collection Time   07/03/11  8:36 PM      Component Value Range Comment   Glucose-Capillary 169 (*) 70 - 99 (mg/dL)   GLUCOSE, CAPILLARY     Status: Abnormal   Collection Time   07/04/11 12:32 AM      Component Value Range Comment   Glucose-Capillary 110 (*) 70 - 99 (mg/dL)   PHOSPHORUS     Status: Normal   Collection Time   07/04/11  5:37 AM      Component Value Range Comment   Phosphorus 3.8  2.3 - 4.6 (mg/dL)   BASIC METABOLIC PANEL     Status: Abnormal   Collection Time   07/04/11  5:37 AM      Component Value Range Comment   Sodium 144  135 - 145 (mEq/L)    Potassium 3.5  3.5 - 5.1 (mEq/L)    Chloride 108  96 - 112 (mEq/L)    CO2 28  19 - 32 (mEq/L)    Glucose, Bld 108 (*) 70 - 99 (mg/dL)    BUN 26 (*) 6 - 23 (mg/dL)    Creatinine, Ser 1.61 (*) 0.50 - 1.35  (mg/dL)    Calcium 8.1 (*) 8.4 - 10.5 (mg/dL)    GFR calc non Af Amer 13 (*) >90 (mL/min)    GFR calc Af Amer 15 (*) >90 (mL/min)   CBC     Status: Abnormal   Collection Time   07/04/11  5:37 AM      Component Value Range Comment   WBC 7.7  4.0 - 10.5 (K/uL)    RBC 3.05 (*) 4.22 - 5.81 (MIL/uL)    Hemoglobin 8.5 (*) 13.0 - 17.0 (g/dL)    HCT 09.6 (*) 04.5 - 52.0 (%)    MCV 90.5  78.0 - 100.0 (fL)    MCH 27.9  26.0 - 34.0 (pg)    MCHC 30.8  30.0 - 36.0 (g/dL)    RDW 40.9 (*) 81.1 - 15.5 (%)    Platelets 244  150 - 400 (K/uL)   GLUCOSE, CAPILLARY     Status: Normal   Collection Time   07/04/11  7:49 AM      Component Value Range Comment   Glucose-Capillary 96  70 - 99 (mg/dL)    Comment 1 Notify RN      Comment 2 Documented in Chart     CARDIAC PANEL(CRET KIN+CKTOT+MB+TROPI)     Status: Normal   Collection Time   07/04/11  9:06 AM      Component Value Range Comment   Total CK 53  7 - 232 (U/L)    CK, MB 3.7  0.3 - 4.0 (ng/mL)    Troponin I <0.30  <0.30 (ng/mL)    Relative Index RELATIVE INDEX IS INVALID  0.0 - 2.5    T4, FREE     Status: Normal   Collection Time   07/04/11  9:06 AM      Component Value Range Comment   Free T4 1.31  0.80 - 1.80 (ng/dL)   GLUCOSE, CAPILLARY     Status: Abnormal   Collection Time   07/04/11 11:44 AM      Component Value Range Comment   Glucose-Capillary 140 (*) 70 - 99 (mg/dL)    Comment 1 Notify RN      Comment 2 Documented in Chart     GLUCOSE, CAPILLARY     Status: Abnormal   Collection Time   07/04/11  4:13 PM      Component Value Range Comment   Glucose-Capillary 152 (*) 70 - 99 (mg/dL)      HPI : The patient is a 61 year old man with a past medical history significant for coronary artery disease, type 2 diabetes mellitus, and chronic kidney disease. His history is also significant for recent hospitalization at [facility] from 06/09/2011 06/30/2011 for treatment of a non-ST elevation myocardial infarction. The patient  subsequently underwent a two-vessel CABG there. He presented to [facility] hospital 2 days following discharge for chief complaint of nausea and vomiting. In the emergency department, he was noted to be afebrile and hemodynamically stable. His lab data were significant for a normal troponin I., elevated serum sodium of 147, elevated creatinine of 4.52, mild anemia with a hemoglobin of 11.1, and an abdominal x-ray that revealed findings suggestive of an ileus. He was admitted for further evaluation and management.  HOSPITAL COURSE: The patient was started on gentle IV  fluids as it appeared that he was volume depleted given his elevated serum sodium and symptomatology. His nausea and vomiting were treated with scheduled IV Zofran. He was started empirically on Protonix intravenously. He was made n.p.o. for bowel rest. For further evaluation, a number of studies were ordered. His cardiac enzymes were all within normal limits. His pro BNP was initially 32,500, however, his chest x-ray revealed no signs of pulmonary edema. His hemoglobin A1c was 6.6. His TSH was 0.265, slightly low but his free T4 was within normal limits at 1.31. His blood magnesium level was within normal limits.  Nephrologist, Dr. Kristian Covey was consulted given that the patient had recently been started on dialysis at [facility] during the hospitalization in December. Following his assessment, he did not believe that the patient needed emergent dialysis given that his BUN and creatinine were stable and that the patient was making urine. He discontinued the IV fluids and started him on IV Lasix at 100 mg IV twice daily. At the time of discharge, his colleague recommended that the patient be discharged on 80 mg of Lasix twice a day. No dialysis was performed during the hospitalization. Rather, the patient will followup with Dr.Bekefadu next week for further evaluation and management. A viral hepatitis panel was ordered by nephrology and it  was negative.  During hospitalization, the patient's nausea and vomiting subsided. His diet was advanced which he tolerated well. He remained hemodynamically stable. His diabetes was well controlled. He was given vancomycin IV during the hospitalization for mild cellulitis of the sternotomy scar. At the time of discharge, the erythema had completely resolved. Therefore, all antibiotics, including the outpatient Augmentin, were discontinued. At the time of hospital discharge, he was hemodynamically stable and asymptomatic. He walked in the hallway at least twice with no ill effects.    Discharge Exam:  Blood pressure 109/61, pulse 64, temperature 98 F (36.7 C), temperature source Oral, resp. rate 20, height 5\' 9"  (1.753 m), weight 67.722 kg (149 lb 4.8 oz), SpO2 90.00%.  Lungs: Clear to auscultation bilaterally. Heart: S1, S2, with no murmurs rubs or gallops. Chest wall: Sternotomy scar without any erythema or drainage. Abdomen: Positive bowel sounds, soft, nontender, nondistended. Extremities: No pedal edema.   Discharge Orders    Future Orders Please Complete By Expires   Diet - low sodium heart healthy      Diet Carb Modified      Increase activity slowly      Discharge instructions      Comments:   YOUR ANTIBIOTIC HAS BEEN DISCONTINUED.  [facility] AND WITH DR. Kristian Covey AS SCHEDULED.      Follow-up Information    Follow up with [facility], MD on 07/11/2011. (Come for Blood Work on 1/14  between 9am and 12pm)    Contact information:   [address] [place] [address] [phone]          Discharge time: 35 minutes.  Signed: Tynslee Bowlds 07/05/2011, 7:14 AM

## 2011-07-20 ENCOUNTER — Ambulatory Visit: Payer: Self-pay | Admitting: Vascular Surgery

## 2011-07-20 LAB — BASIC METABOLIC PANEL
Anion Gap: 10 (ref 7–16)
BUN: 23 mg/dL — ABNORMAL HIGH (ref 7–18)
Chloride: 107 mmol/L (ref 98–107)
Co2: 25 mmol/L (ref 21–32)
Creatinine: 3.16 mg/dL — ABNORMAL HIGH (ref 0.60–1.30)
Osmolality: 291 (ref 275–301)
Potassium: 4.9 mmol/L (ref 3.5–5.1)

## 2012-01-19 ENCOUNTER — Encounter (HOSPITAL_COMMUNITY): Payer: Self-pay

## 2012-01-19 ENCOUNTER — Emergency Department (HOSPITAL_COMMUNITY)
Admission: EM | Admit: 2012-01-19 | Discharge: 2012-01-19 | Disposition: A | Discharge status: Home / Self Care | Payer: Medicare Other | Attending: Emergency Medicine | Admitting: Emergency Medicine

## 2012-01-19 ENCOUNTER — Emergency Department (HOSPITAL_COMMUNITY): Payer: Medicare Other

## 2012-01-19 DIAGNOSIS — I120 Hypertensive chronic kidney disease with stage 5 chronic kidney disease or end stage renal disease: Secondary | ICD-10-CM | POA: Insufficient documentation

## 2012-01-19 DIAGNOSIS — D869 Sarcoidosis, unspecified: Secondary | ICD-10-CM | POA: Insufficient documentation

## 2012-01-19 DIAGNOSIS — Z794 Long term (current) use of insulin: Secondary | ICD-10-CM | POA: Insufficient documentation

## 2012-01-19 DIAGNOSIS — R109 Unspecified abdominal pain: Secondary | ICD-10-CM

## 2012-01-19 DIAGNOSIS — Z79899 Other long term (current) drug therapy: Secondary | ICD-10-CM | POA: Diagnosis not present

## 2012-01-19 DIAGNOSIS — N186 End stage renal disease: Secondary | ICD-10-CM | POA: Insufficient documentation

## 2012-01-19 DIAGNOSIS — E119 Type 2 diabetes mellitus without complications: Secondary | ICD-10-CM | POA: Diagnosis not present

## 2012-01-19 DIAGNOSIS — I2510 Atherosclerotic heart disease of native coronary artery without angina pectoris: Secondary | ICD-10-CM | POA: Insufficient documentation

## 2012-01-19 DIAGNOSIS — R1013 Epigastric pain: Secondary | ICD-10-CM | POA: Diagnosis not present

## 2012-01-19 DIAGNOSIS — R079 Chest pain, unspecified: Secondary | ICD-10-CM | POA: Insufficient documentation

## 2012-01-19 DIAGNOSIS — I252 Old myocardial infarction: Secondary | ICD-10-CM | POA: Insufficient documentation

## 2012-01-19 DIAGNOSIS — M109 Gout, unspecified: Secondary | ICD-10-CM | POA: Insufficient documentation

## 2012-01-19 LAB — HEPATIC FUNCTION PANEL
ALT: 9 U/L (ref 0–53)
AST: 12 U/L (ref 0–37)
Alkaline Phosphatase: 114 U/L (ref 39–117)
Bilirubin, Direct: 0.1 mg/dL (ref 0.0–0.3)
Indirect Bilirubin: 0.7 mg/dL (ref 0.3–0.9)
Total Protein: 6.2 g/dL (ref 6.0–8.3)

## 2012-01-19 LAB — BASIC METABOLIC PANEL
CO2: 27 mEq/L (ref 19–32)
Calcium: 8.8 mg/dL (ref 8.4–10.5)
Chloride: 103 mEq/L (ref 96–112)
Creatinine, Ser: 2.6 mg/dL — ABNORMAL HIGH (ref 0.50–1.35)
Glucose, Bld: 179 mg/dL — ABNORMAL HIGH (ref 70–99)

## 2012-01-19 LAB — CBC
Hemoglobin: 11.2 g/dL — ABNORMAL LOW (ref 13.0–17.0)
MCH: 31.6 pg (ref 26.0–34.0)
MCV: 94.9 fL (ref 78.0–100.0)
RBC: 3.54 MIL/uL — ABNORMAL LOW (ref 4.22–5.81)

## 2012-01-19 MED ORDER — HYDROMORPHONE HCL PF 1 MG/ML IJ SOLN
0.5000 mg | Freq: Once | INTRAMUSCULAR | Status: AC
  Administered 2012-01-19: 0.5 mg via INTRAVENOUS
  Filled 2012-01-19: qty 1

## 2012-01-19 MED ORDER — ONDANSETRON HCL 4 MG/2ML IJ SOLN
4.0000 mg | Freq: Once | INTRAMUSCULAR | Status: AC
  Administered 2012-01-19: 4 mg via INTRAVENOUS
  Filled 2012-01-19: qty 2

## 2012-01-19 MED ORDER — HYDROMORPHONE HCL 4 MG PO TABS: 2.0000 mg | ORAL_TABLET | ORAL | Status: AC | PRN

## 2012-01-19 MED ORDER — HYDROMORPHONE HCL PF 1 MG/ML IJ SOLN
1.0000 mg | Freq: Once | INTRAMUSCULAR | Status: AC
  Administered 2012-01-19: 1 mg via INTRAVENOUS
  Filled 2012-01-19: qty 1

## 2012-01-19 MED ORDER — ONDANSETRON HCL 8 MG PO TABS: 8.0000 mg | ORAL_TABLET | ORAL | Status: AC | PRN

## 2012-01-19 NOTE — ED Notes (Signed)
Family at bedside. 

## 2012-01-19 NOTE — ED Notes (Signed)
Patient is comfortable at this time. 

## 2012-01-19 NOTE — ED Notes (Signed)
Patient is comfortable and states he feels better now.

## 2012-01-19 NOTE — ED Notes (Signed)
Pt sleeping and 02 sat decreased to 86% on room air.  Placed pt on o2 at 2 liters and sat increased to 93%.  Denies any SOB.

## 2012-01-19 NOTE — ED Notes (Signed)
Pt reports that he began having left and right side chest pain last night, mi in dec. 2012, denies any sob, +nausea and vomited x1.   +cough for several days

## 2012-01-19 NOTE — ED Notes (Signed)
MD at bedside. 

## 2012-01-19 NOTE — ED Notes (Signed)
Patient would like to know how much longer till he knows the results. RN Tammy Sours aware.

## 2012-01-19 NOTE — ED Notes (Signed)
Pt c/o bilateral chest pain since midnight. Pt also states he vomited x once. Pt went to dialysis today, but did not finish.

## 2012-01-19 NOTE — ED Provider Notes (Signed)
History    This chart was scribed for Juan Hutching, MD, MD by Smitty Pluck. The patient was seen in room APA04 and the patient's care was started at 8:12AM.   CSN: [id number]  Arrival date & time 01/19/12  4098   First MD Initiated Contact with Patient 01/19/12 [phone]      Chief Complaint  Patient presents with  . Chest Pain    (Consider location/radiation/quality/duration/timing/severity/associated sxs/prior treatment) Patient is a 61 y.o. male presenting with chest pain. The history is provided by the patient.  Chest Pain    Juan Rowe is a 61 y.o. male who presents to the Emergency Department complaining of constant, moderate RUQ and LUQ pain radiating to back onset this AM around 12AM. Reports that he has decreased appetite. He reports having nausea and emesis 1x. Reports that he can not get comfortable.  Pt had 2 MI in December and 2 vessel cabbage at [facility] in December. Pt has kidney failure and receives diaysis t/th/sat. Pt was at dialysis today but was only there 1 hour out of the 3 hour total. He has diabetes, sarcoidosis, ischemic cardiomyopathy and CAD. Reports agent orange contamination. Pt goes to [place] in [place] for medical treatment.   Past Medical History  Diagnosis Date  . Coronary artery disease   . MI, acute, non ST segment elevation December 2012    [facility]  . Diabetes mellitus   . Ischemic cardiomyopathy   . End stage renal disease on dialysis December 2012    Started dialysis  . Sarcoidosis   . Gout   . Renal cell carcinoma 2008  . Hypertension   . Blood loss anemia December 2012    Status post CABG. Transfused 2 units blood  . Acute respiratory failure December 2012    Secondary to MI  . Cardiogenic shock December 2012  . Depression     Past Surgical History  Procedure Date  . Nephrectomy 2008    On the left  . Double-j ureteral stent placed   . Av fistula placement, radiocephalic   . Coronary artery bypass graft December 2012   Two-vessel, [facility].    No family history on file.  History  Substance Use Topics  . Smoking status: Never Smoker   . Smokeless tobacco: Never Used  . Alcohol Use: No      Review of Systems  Cardiovascular: Positive for chest pain.  All other systems reviewed and are negative.   10 Systems reviewed and all are negative for acute change except as noted in the HPI.   Allergies  Hydrocodone; Augmentin; and Sudafed  Home Medications   Current Outpatient Rx  Name Route Sig Dispense Refill  . ACETAMINOPHEN 325 MG PO TABS Oral Take 325 mg by mouth every 6 (six) hours as needed.      . ALLOPURINOL 100 MG PO TABS Oral Take 100 mg by mouth every Monday, Wednesday, and Friday.      . ASPIRIN EC 81 MG PO TBEC Oral Take 81 mg by mouth daily.      Marland Kitchen CALCITRIOL 0.25 MCG PO CAPS Oral Take 0.25 mcg by mouth every Monday, Wednesday, and Friday.      Marland Kitchen CALCIUM ACETATE 667 MG PO CAPS Oral Take 667 mg by mouth 3 (three) times daily with meals.      . DOCUSATE SODIUM 100 MG PO CAPS Oral Take 100 mg by mouth 2 (two) times daily as needed. For constipation     .  ESCITALOPRAM OXALATE 10 MG PO TABS Oral Take 10 mg by mouth daily.      . FUROSEMIDE 20 MG PO TABS Oral Take 4 tablets (80 mg total) by mouth 2 (two) times daily. 60 tablet 1  . INSULIN ASPART 100 UNIT/ML [place] SOLN Subcutaneous Inject 3 Units into the skin 3 (three) times daily before meals.      . INSULIN GLARGINE 100 UNIT/ML [place] SOLN Subcutaneous Inject 14 Units into the skin at bedtime.      Marland Kitchen METOPROLOL TARTRATE 25 MG PO TABS Oral Take 12.5 mg by mouth 2 (two) times daily.      Marland Kitchen OMEPRAZOLE 20 MG PO CPDR Oral Take 20 mg by mouth daily.      . OXYCODONE HCL 5 MG PO TABS Oral Take 5 mg by mouth every 6 (six) hours as needed. For pain     . POTASSIUM CHLORIDE CRYS ER 20 MEQ PO TBCR Oral Take 1 tablet (20 mEq total) by mouth daily. 30 tablet 0  . PREDNISONE 2.5 MG PO TABS Oral Take 7.5 mg by mouth daily.      . SENNA 8.6 MG PO  TABS Oral Take 2 tablets by mouth at bedtime as needed. For constipation     . SIMVASTATIN 40 MG PO TABS Oral Take 40 mg by mouth every evening.      . TRIAMCINOLONE ACETONIDE 0.1 % EX CREA Topical Apply 1 application topically 2 (two) times daily.        BP 186/64  Pulse 62  Temp 97.9 F (36.6 C) (Oral)  Resp 20  Ht 5\' 9"  (1.753 m)  Wt 142 lb (64.411 kg)  BMI 20.97 kg/m2  SpO2 96%  Physical Exam  Nursing note and vitals reviewed. Constitutional: He is oriented to person, place, and time. He appears well-developed and well-nourished. No distress.  HENT:  Head: Normocephalic and atraumatic.  Eyes: Conjunctivae and EOM are normal. Pupils are equal, round, and reactive to light.  Neck: Normal range of motion. Neck supple.  Cardiovascular: Normal rate and regular rhythm.   Pulmonary/Chest: Effort normal and breath sounds normal. No respiratory distress.  Abdominal: Soft. Bowel sounds are normal. There is tenderness (RUQ and LUQ miminal to moderate).  Musculoskeletal: Normal range of motion.  Neurological: He is alert and oriented to person, place, and time.  Skin: Skin is warm and dry.  Psychiatric: He has a normal mood and affect. His behavior is normal.    ED Course  Procedures (including critical care time) DIAGNOSTIC STUDIES: Oxygen Saturation is 96% on room air, normal by my interpretation.    COORDINATION OF CARE: 8:16AM EDP discusses pt ED treatment with pt (pain medication, acute abdominal series, lipase, blood work) 8:45AM EDP ordered medication:  Scheduled Meds:    .  HYDROmorphone (DILAUDID) injection  1 mg Intravenous Once  . ondansetron  4 mg Intravenous Once   Continuous Infusions:  PRN Meds:.    Labs Reviewed  CBC - Abnormal; Notable for the following:    RBC 3.54 (*)     Hemoglobin 11.2 (*)     HCT 33.6 (*)     All other components within normal limits  BASIC METABOLIC PANEL - Abnormal; Notable for the following:    Potassium 3.3 (*)     Glucose,  Bld 179 (*)     Creatinine, Ser 2.60 (*)     GFR calc non Af Amer 25 (*)     GFR calc Af Amer 29 (*)  All other components within normal limits  TROPONIN I  HEPATIC FUNCTION PANEL  LIPASE, BLOOD   Dg Chest 2 View  01/19/2012  *RADIOLOGY REPORT*  Clinical Data: Chest pain.  CHEST - 2 VIEW  Comparison: 07/14/2011.  Findings: The heart is enlarged but stable.  There are surgical changes from bypass surgery.  Mild tortuosity and ectasia of the thoracic aorta is again noted.  The lungs are clear.  No pleural effusion.  The bony thorax is intact.  IMPRESSION: Cardiac enlargement but no acute pulmonary findings.  Original Report Authenticated By: P. Loralie Champagne, M.D.     No diagnosis found.  Date: 01/19/2012  Rate: 62  Rhythm: normal sinus rhythm  QRS Axis: normal  Intervals: normal  ST/T Wave abnormalities: normal  Conduction Disutrbances:none  Narrative Interpretation:   Old EKG Reviewed: changes noted LVH  MDM  Patient is hemodynamically stable. No acute abdomen. Minimally tender over epigastrium. Discharge home on by mouth Dilaudid and Zofran. He understands to return if worse  I personally performed the services described in this documentation, which was scribed in my presence. The recorded information has been reviewed and considered.       Juan Hutching, MD 01/19/12 1355

## 2012-01-24 ENCOUNTER — Other Ambulatory Visit: Payer: Self-pay

## 2012-01-24 ENCOUNTER — Emergency Department (HOSPITAL_COMMUNITY): Payer: Medicare Other

## 2012-01-24 ENCOUNTER — Encounter (HOSPITAL_COMMUNITY): Payer: Self-pay | Admitting: Emergency Medicine

## 2012-01-24 ENCOUNTER — Encounter (HOSPITAL_COMMUNITY): Payer: Self-pay

## 2012-01-24 ENCOUNTER — Emergency Department (HOSPITAL_COMMUNITY)
Admission: EM | Admit: 2012-01-24 | Discharge: 2012-01-24 | Disposition: A | Discharge status: Home / Self Care | Payer: Medicare Other | Source: Home / Self Care | Attending: Emergency Medicine | Admitting: Emergency Medicine

## 2012-01-24 ENCOUNTER — Emergency Department (HOSPITAL_COMMUNITY)
Admission: EM | Admit: 2012-01-24 | Discharge: 2012-01-24 | Disposition: A | Discharge status: Home / Self Care | Payer: Medicare Other | Attending: Emergency Medicine | Admitting: Emergency Medicine

## 2012-01-24 DIAGNOSIS — F329 Major depressive disorder, single episode, unspecified: Secondary | ICD-10-CM | POA: Insufficient documentation

## 2012-01-24 DIAGNOSIS — R0602 Shortness of breath: Secondary | ICD-10-CM | POA: Insufficient documentation

## 2012-01-24 DIAGNOSIS — Z794 Long term (current) use of insulin: Secondary | ICD-10-CM | POA: Insufficient documentation

## 2012-01-24 DIAGNOSIS — R079 Chest pain, unspecified: Secondary | ICD-10-CM | POA: Insufficient documentation

## 2012-01-24 DIAGNOSIS — Z992 Dependence on renal dialysis: Secondary | ICD-10-CM

## 2012-01-24 DIAGNOSIS — M109 Gout, unspecified: Secondary | ICD-10-CM | POA: Insufficient documentation

## 2012-01-24 DIAGNOSIS — R609 Edema, unspecified: Secondary | ICD-10-CM | POA: Insufficient documentation

## 2012-01-24 DIAGNOSIS — Z79899 Other long term (current) drug therapy: Secondary | ICD-10-CM | POA: Insufficient documentation

## 2012-01-24 DIAGNOSIS — I252 Old myocardial infarction: Secondary | ICD-10-CM | POA: Insufficient documentation

## 2012-01-24 DIAGNOSIS — I120 Hypertensive chronic kidney disease with stage 5 chronic kidney disease or end stage renal disease: Secondary | ICD-10-CM | POA: Insufficient documentation

## 2012-01-24 DIAGNOSIS — Z85528 Personal history of other malignant neoplasm of kidney: Secondary | ICD-10-CM | POA: Insufficient documentation

## 2012-01-24 DIAGNOSIS — D869 Sarcoidosis, unspecified: Secondary | ICD-10-CM | POA: Insufficient documentation

## 2012-01-24 DIAGNOSIS — E119 Type 2 diabetes mellitus without complications: Secondary | ICD-10-CM | POA: Insufficient documentation

## 2012-01-24 DIAGNOSIS — I2510 Atherosclerotic heart disease of native coronary artery without angina pectoris: Secondary | ICD-10-CM | POA: Insufficient documentation

## 2012-01-24 DIAGNOSIS — R222 Localized swelling, mass and lump, trunk: Secondary | ICD-10-CM | POA: Insufficient documentation

## 2012-01-24 DIAGNOSIS — N186 End stage renal disease: Secondary | ICD-10-CM | POA: Insufficient documentation

## 2012-01-24 DIAGNOSIS — R918 Other nonspecific abnormal finding of lung field: Secondary | ICD-10-CM

## 2012-01-24 DIAGNOSIS — F3289 Other specified depressive episodes: Secondary | ICD-10-CM | POA: Insufficient documentation

## 2012-01-24 LAB — CBC WITH DIFFERENTIAL/PLATELET
Basophils Relative: 0 % (ref 0–1)
Eosinophils Absolute: 0.1 10*3/uL (ref 0.0–0.7)
HCT: 36.4 % — ABNORMAL LOW (ref 39.0–52.0)
Hemoglobin: 12.3 g/dL — ABNORMAL LOW (ref 13.0–17.0)
Hemoglobin: 11.9 g/dL — ABNORMAL LOW (ref 13.0–17.0)
Lymphs Abs: 1.4 10*3/uL (ref 0.7–4.0)
Lymphs Abs: 0.7 10*3/uL (ref 0.7–4.0)
MCH: 31.9 pg (ref 26.0–34.0)
MCH: 31.4 pg (ref 26.0–34.0)
MCHC: 32.8 g/dL (ref 30.0–36.0)
Monocytes Absolute: 0.9 10*3/uL (ref 0.1–1.0)
Monocytes Relative: 9 % (ref 3–12)
Neutro Abs: 7.4 10*3/uL (ref 1.7–7.7)
Neutrophils Relative %: 80 % — ABNORMAL HIGH (ref 43–77)
Neutrophils Relative %: 81 % — ABNORMAL HIGH (ref 43–77)
Platelets: 258 10*3/uL (ref 150–400)
RBC: 3.79 MIL/uL — ABNORMAL LOW (ref 4.22–5.81)
WBC: 10.8 10*3/uL — ABNORMAL HIGH (ref 4.0–10.5)

## 2012-01-24 LAB — BASIC METABOLIC PANEL
BUN: 12 mg/dL (ref 6–23)
Chloride: 101 mEq/L (ref 96–112)
Creatinine, Ser: 2.02 mg/dL — ABNORMAL HIGH (ref 0.50–1.35)
GFR calc Af Amer: 17 mL/min — ABNORMAL LOW (ref >90)
GFR calc non Af Amer: 15 mL/min — ABNORMAL LOW (ref >90)
Glucose, Bld: 128 mg/dL — ABNORMAL HIGH (ref 70–99)
Potassium: 4.1 mEq/L (ref 3.5–5.1)
Potassium: 3.5 mEq/L (ref 3.5–5.1)
Sodium: 137 mEq/L (ref 135–145)

## 2012-01-24 LAB — TROPONIN I: Troponin I: <0.3 ng/mL (ref <0.30)

## 2012-01-24 LAB — PRO B NATRIURETIC PEPTIDE: Pro B Natriuretic peptide (BNP): 29346 pg/mL — ABNORMAL HIGH (ref 0–125)

## 2012-01-24 MED ORDER — ONDANSETRON 4 MG PO TBDP
4.0000 mg | ORAL_TABLET | Freq: Once | ORAL | Status: AC
  Administered 2012-01-24: 4 mg via ORAL
  Filled 2012-01-24: qty 1

## 2012-01-24 MED ORDER — HYDROCOD POLST-CHLORPHEN POLST 10-8 MG/5ML PO LQCR
5.0000 mL | Freq: Once | ORAL | Status: AC
  Administered 2012-01-24: 5 mL via ORAL
  Filled 2012-01-24: qty 5

## 2012-01-24 MED ORDER — PROMETHAZINE-CODEINE 6.25-10 MG/5ML PO SYRP: 5.0000 mL | ORAL_SOLUTION | ORAL | Status: AC | PRN

## 2012-01-24 MED ORDER — IOHEXOL 350 MG/ML SOLN
100.0000 mL | Freq: Once | INTRAVENOUS | Status: AC | PRN
  Administered 2012-01-24: 100 mL via INTRAVENOUS

## 2012-01-24 NOTE — ED Notes (Signed)
Pt c/o dry cough x2 days as well as chills which began early this morning. Pt was seen here at 2am for cough and SOB but chest xray was negative. Pt was told he to go to dialysis. Pt states that while at dialysis staff told him to return to ED to rule out pneumonia. Pt currently denies SOB but cough is persistent.

## 2012-01-24 NOTE — ED Provider Notes (Signed)
Medical screening examination/treatment/procedure(s) were conducted as a shared visit with non-physician practitioner(s) and myself.  I personally evaluated the patient during the encounter   The patient is alert, comfortable, now, with normal oxygen saturation on room air. His respiratory effort is normal.   Patient evaluated, and has mild, pulmonary edema. He was dialyzed today. Is no evidence for pneumonia. He has incidental finding of right mediastinal mass. This could represent a malignancy or be related to his sarcoidosis. He is stable for discharge. He'll followup with both his nephrologist and primary care provider.  Flint Melter, MD 01/24/12 1254

## 2012-01-24 NOTE — ED Notes (Signed)
Patient complaining of shortness of breath, orthopnea, and central chest pain. States he woke up at approximately 0000 with trouble breathing.

## 2012-01-24 NOTE — ED Notes (Signed)
Patient also complaining of congestion x 1 week. States he has been taking codeine cough syrup, but is out now. Room air oxygen saturation 88-90%. Patient placed on 2 liter of oxygen via nasal canula.

## 2012-01-24 NOTE — ED Notes (Signed)
Pt reports was here around 2am this morning with c/o of cough, chest pain, and SOB.  Reports went to dialysis and was told by dialysis staff to return to ED to R/O pneumonia.  Pt says can't stop coughing and c/o chills.

## 2012-01-24 NOTE — ED Provider Notes (Signed)
History     CSN: [id number]  Arrival date & time 01/24/12  0246   None     Chief Complaint  Patient presents with  . Chest Pain  . Shortness of Breath    (Consider location/radiation/quality/duration/timing/severity/associated sxs/prior treatment) HPI  Juan Rowe is a 61 y.o. male with a h/o, CAD, DM, HTN, ischemic cardiomyopathy,  ESRD on dialysis T-Th-S  who presents to the Emergency Department complaining of increasing shortness of breath that began last night, orthopnea and chest discomfort when trying to take a deep breath. He is scheduled for dialysis at 5:30 AM.  Past Medical History  Diagnosis Date  . Coronary artery disease   . MI, acute, non ST segment elevation December 2012    [facility]  . Diabetes mellitus   . Ischemic cardiomyopathy   . End stage renal disease on dialysis December 2012    Started dialysis  . Sarcoidosis   . Gout   . Renal cell carcinoma 2008  . Hypertension   . Blood loss anemia December 2012    Status post CABG. Transfused 2 units blood  . Acute respiratory failure December 2012    Secondary to MI  . Cardiogenic shock December 2012  . Depression     Past Surgical History  Procedure Date  . Nephrectomy 2008    On the left  . Double-j ureteral stent placed   . Av fistula placement, radiocephalic   . Coronary artery bypass graft December 2012    Two-vessel, [facility].    History reviewed. No pertinent family history.  History  Substance Use Topics  . Smoking status: Never Smoker   . Smokeless tobacco: Never Used  . Alcohol Use: No      Review of Systems  Constitutional: Negative for fever.       10 Systems reviewed and are negative for acute change except as noted in the HPI.  HENT: Negative for congestion.   Eyes: Negative for discharge and redness.  Respiratory: Positive for chest tightness and shortness of breath. Negative for cough.   Cardiovascular: Negative for chest pain.    Gastrointestinal: Negative for vomiting and abdominal pain.  Musculoskeletal: Negative for back pain.  Skin: Negative for rash.  Neurological: Negative for syncope, numbness and headaches.  Psychiatric/Behavioral:       No behavior change.    Allergies  Hydrocodone; Augmentin; and Sudafed  Home Medications   Current Outpatient Rx  Name Route Sig Dispense Refill  . ACETAMINOPHEN 325 MG PO TABS Oral Take 325-650 mg by mouth every 6 (six) hours as needed. For pain    . ALLOPURINOL 100 MG PO TABS Oral Take 100 mg by mouth daily.     . ASPIRIN EC 81 MG PO TBEC Oral Take 81 mg by mouth daily.      Marland Kitchen CALCITRIOL 0.25 MCG PO CAPS Oral Take 0.25 mcg by mouth every Monday, Wednesday, and Friday.      . OMEGA-3 FATTY ACIDS 1000 MG PO CAPS Oral Take 2 g by mouth 2 (two) times daily.    Marland Kitchen HYDROMORPHONE HCL 4 MG PO TABS Oral Take 0.5 tablets (2 mg total) by mouth every 4 (four) hours as needed for pain. 20 tablet 0  . INSULIN ASPART 100 UNIT/ML [place] SOLN Subcutaneous Inject 10 Units into the skin 3 (three) times daily before meals. Sliding Scale: states 10 to unknown max amount    . INSULIN GLARGINE 100 UNIT/ML [place] SOLN Subcutaneous Inject 30 Units into  the skin at bedtime.     Marland Kitchen LISINOPRIL 5 MG PO TABS Oral Take 2.5 mg by mouth daily.    Marland Kitchen METOPROLOL SUCCINATE ER 25 MG PO TB24 Oral Take 12.5 mg by mouth daily.    Marland Kitchen OMEPRAZOLE 20 MG PO CPDR Oral Take 20 mg by mouth daily.      Marland Kitchen ONDANSETRON HCL 8 MG PO TABS Oral Take 1 tablet (8 mg total) by mouth every 4 (four) hours as needed for nausea. 10 tablet 0  . POTASSIUM CHLORIDE CRYS ER 20 MEQ PO TBCR Oral Take 1 tablet (20 mEq total) by mouth daily. 30 tablet 0  . PREDNISONE 2.5 MG PO TABS Oral Take 2.5 mg by mouth daily.     Marland Kitchen SIMVASTATIN 40 MG PO TABS Oral Take 20 mg by mouth every evening.     . TRIAMCINOLONE ACETONIDE 0.1 % EX CREA Topical Apply 1 application topically as needed. For skin irritation      BP 169/83  Pulse 93  Temp 99.5 F (37.5 C)  (Oral)  Resp 22  Ht 5\' 9"  (1.753 m)  Wt 145 lb (65.772 kg)  BMI 21.41 kg/m2  SpO2 90%  Physical Exam  Nursing note and vitals reviewed. Constitutional: He appears well-developed and well-nourished. No distress.       Awake, alert, nontoxic appearance.  HENT:  Head: Atraumatic.  Right Ear: External ear normal.  Left Ear: External ear normal.  Nose: Nose normal.  Mouth/Throat: Oropharynx is clear and moist.  Eyes: Conjunctivae are normal. Pupils are equal, round, and reactive to light. Right eye exhibits no discharge. Left eye exhibits no discharge.  Neck: Normal range of motion. Neck supple.  Cardiovascular: Normal heart sounds.   Pulmonary/Chest: Effort normal. He exhibits no tenderness.       Crackles at both bases  Abdominal: Soft. There is no tenderness. There is no rebound.  Musculoskeletal: He exhibits no tenderness.       Baseline ROM, no obvious new focal weakness. AVG in left arm with good bruit and thrill.  Neurological:       Mental status and motor strength appears baseline for patient and situation.  Skin: No rash noted.  Psychiatric: He has a normal mood and affect.    ED Course  Procedures (including critical care time) Results for orders placed during the hospital encounter of 01/24/12  POCT I-STAT TROPONIN I      Component Value Range   Troponin i, poc 0.05  0.00 - 0.08 ng/mL   Comment 3           CBC WITH DIFFERENTIAL      Component Value Range   WBC 10.8 (*) 4.0 - 10.5 K/uL   RBC 3.86 (*) 4.22 - 5.81 MIL/uL   Hemoglobin 12.3 (*) 13.0 - 17.0 g/dL   HCT 16.1 (*) 09.6 - 04.5 %   MCV 97.2  78.0 - 100.0 fL   MCH 31.9  26.0 - 34.0 pg   MCHC 32.8  30.0 - 36.0 g/dL   RDW 40.9  81.1 - 91.4 %   Platelets 258  150 - 400 K/uL   Neutrophils Relative 80 (*) 43 - 77 %   Neutro Abs 8.6 (*) 1.7 - 7.7 K/uL   Lymphocytes Relative 13  12 - 46 %   Lymphs Abs 1.4  0.7 - 4.0 K/uL   Monocytes Relative 6  3 - 12 %   Monocytes Absolute 0.6  0.1 - 1.0 K/uL   Eosinophils  Relative 1  0 - 5 %   Eosinophils Absolute 0.1  0.0 - 0.7 K/uL   Basophils Relative 0  0 - 1 %   Basophils Absolute 0.0  0.0 - 0.1 K/uL  BASIC METABOLIC PANEL      Component Value Range   Sodium 137  135 - 145 mEq/L   Potassium 4.1  3.5 - 5.1 mEq/L   Chloride 99  96 - 112 mEq/L   CO2 26  19 - 32 mEq/L   Glucose, Bld 160 (*) 70 - 99 mg/dL   BUN 33 (*) 6 - 23 mg/dL   Creatinine, Ser 1.91 (*) 0.50 - 1.35 mg/dL   Calcium 9.0  8.4 - 47.8 mg/dL   GFR calc non Af Amer 15 (*) >90 mL/min   GFR calc Af Amer 17 (*) >90 mL/min  PRO B NATRIURETIC PEPTIDE      Component Value Range   Pro B Natriuretic peptide (BNP) 29346.0 (*) 0 - 125 pg/mL    Date: 01/24/2012  0253  Rate:93  Rhythm: normal sinus rhythm  QRS Axis: left  Intervals: normal  ST/T Wave abnormalities: normal  Conduction Disutrbances:none  Narrative Interpretation:   Old EKG Reviewed: unchanged c/w 01/19/12  Dg Chest 2 View  01/24/2012  *RADIOLOGY REPORT*  Clinical Data: Chest pain, shortness of breath.  CHEST - 2 VIEW  Comparison: 01/19/2012  Findings: Prominent cardiomediastinal contours status post median sternotomy and CABG.  Interstitial prominence and minimal lung base opacities otherwise without focal consolidation or pneumothorax. Trace pleural fluid not excluded.  No acute osseous finding. Surgical clips within the posterior abdomen on the lateral view.  IMPRESSION: Prominent cardiomediastinal contours, similar to prior.  Mild increased interstitial prominence may reflect mild interstitial edema or atypical infection.  Mild lung base opacities, likely atelectasis. Trace pleural fluid not excluded.  Original Report Authenticated By: Waneta Martins, M.D.   MDM  Patient with ESRD here short of breath. EKG unremarkable. Labs with ESRD, elevated bnp 29,346 c/w CHF and need for dialysis. Chest xray showing interstitial edema. Patient going directly to dialysis from here. Dx testing d/w pt and family.  Questions answered.  Verb  understanding, agreeable to d/c home with outpt f/u.Pt stable in ED with no significant deterioration in condition.The patient appears reasonably screened and/or stabilized for discharge and I doubt any other medical condition or other [facility] requiring further screening, evaluation, or treatment in the ED at this time prior to discharge.  MDM Reviewed: nursing note and vitals Interpretation: labs, ECG and x-ray            Nicoletta Dress. Colon Branch, MD 01/24/12 2956

## 2012-01-24 NOTE — ED Notes (Signed)
Pt was c/o mild SOB so he was placed on 2L of O2. Pt was able to ambulate to restroom without difficulty.

## 2012-01-24 NOTE — ED Provider Notes (Signed)
History     CSN: [id number]  Arrival date & time 01/24/12  7829   First MD Initiated Contact with Patient 01/24/12 [phone]      Chief Complaint  Patient presents with  . Cough  . Shortness of Breath  . Chest Pain    (Consider location/radiation/quality/duration/timing/severity/associated sxs/prior treatment) Patient is a 61 y.o. male presenting with cough, shortness of breath, and chest pain. The history is provided by the patient.  Cough This is a new problem. The current episode started more than 2 days ago. The problem occurs every few minutes. The problem has been gradually worsening. The cough is non-productive. The fever has been present for 1 to 2 days. Associated symptoms include chest pain, chills, myalgias and shortness of breath. Pertinent negatives include no sore throat and no wheezing. He has tried nothing for the symptoms. The treatment provided no relief. He is not a smoker. His past medical history is significant for bronchitis. Past medical history comments: ESRD, Ischemic cardiomyopathy, CAD.Marland Kitchen  Shortness of Breath  Associated symptoms include chest pain, cough and shortness of breath. Pertinent negatives include no sore throat and no wheezing. Past medical history comments: ESRD, Ischemic cardiomyopathy, CAD.Marland Kitchen  Chest Pain Primary symptoms include fatigue, shortness of breath and cough. Pertinent negatives for primary symptoms include no wheezing, no palpitations, no abdominal pain and no dizziness.  Pertinent negatives for past medical history include no seizures. Past medical history comments: ESRD, Ischemic cardiomyopathy, CAD.     Past Medical History  Diagnosis Date  . Coronary artery disease   . MI, acute, non ST segment elevation December 2012    [facility]  . Diabetes mellitus   . Ischemic cardiomyopathy   . End stage renal disease on dialysis December 2012    Started dialysis  . Sarcoidosis   . Gout   . Renal cell carcinoma 2008  . Hypertension    . Blood loss anemia December 2012    Status post CABG. Transfused 2 units blood  . Acute respiratory failure December 2012    Secondary to MI  . Cardiogenic shock December 2012  . Depression     Past Surgical History  Procedure Date  . Nephrectomy 2008    On the left  . Double-j ureteral stent placed   . Av fistula placement, radiocephalic   . Coronary artery bypass graft December 2012    Two-vessel, [facility].    No family history on file.  History  Substance Use Topics  . Smoking status: Never Smoker   . Smokeless tobacco: Never Used  . Alcohol Use: No      Review of Systems  Constitutional: Positive for chills and fatigue. Negative for activity change.       All ROS Neg except as noted in HPI  HENT: Negative for nosebleeds, sore throat and neck pain.   Eyes: Negative for photophobia and discharge.  Respiratory: Positive for cough and shortness of breath. Negative for wheezing.   Cardiovascular: Positive for chest pain. Negative for palpitations.  Gastrointestinal: Negative for abdominal pain and blood in stool.  Genitourinary: Negative for dysuria, frequency and hematuria.  Musculoskeletal: Positive for myalgias. Negative for back pain and arthralgias.  Skin: Negative.   Neurological: Negative for dizziness, seizures and speech difficulty.  Psychiatric/Behavioral: Negative for hallucinations and confusion.    Allergies  Hydrocodone; Augmentin; and Sudafed  Home Medications   Current Outpatient Rx  Name Route Sig Dispense Refill  . ACETAMINOPHEN 325 MG PO TABS Oral Take  325-650 mg by mouth every 6 (six) hours as needed. For pain    . ALLOPURINOL 100 MG PO TABS Oral Take 100 mg by mouth daily.     . ASPIRIN EC 81 MG PO TBEC Oral Take 81 mg by mouth daily.      Marland Kitchen CALCITRIOL 0.25 MCG PO CAPS Oral Take 0.25 mcg by mouth every Monday, Wednesday, and Friday.      . OMEGA-3 FATTY ACIDS 1000 MG PO CAPS Oral Take 2 g by mouth 2 (two) times daily.    Marland Kitchen  HYDROMORPHONE HCL 4 MG PO TABS Oral Take 0.5 tablets (2 mg total) by mouth every 4 (four) hours as needed for pain. 20 tablet 0  . INSULIN ASPART 100 UNIT/ML [employer] SOLN Subcutaneous Inject 10 Units into the skin 3 (three) times daily before meals. Sliding Scale: states 10 to unknown max amount    . INSULIN GLARGINE 100 UNIT/ML [place] SOLN Subcutaneous Inject 30 Units into the skin at bedtime.     Marland Kitchen LISINOPRIL 5 MG PO TABS Oral Take 2.5 mg by mouth daily.    Marland Kitchen METOPROLOL SUCCINATE ER 25 MG PO TB24 Oral Take 12.5 mg by mouth daily.    Marland Kitchen OMEPRAZOLE 20 MG PO CPDR Oral Take 20 mg by mouth daily.      Marland Kitchen ONDANSETRON HCL 8 MG PO TABS Oral Take 1 tablet (8 mg total) by mouth every 4 (four) hours as needed for nausea. 10 tablet 0  . POTASSIUM CHLORIDE CRYS ER 20 MEQ PO TBCR Oral Take 1 tablet (20 mEq total) by mouth daily. 30 tablet 0  . PREDNISONE 2.5 MG PO TABS Oral Take 2.5 mg by mouth daily.     Marland Kitchen SIMVASTATIN 40 MG PO TABS Oral Take 20 mg by mouth every evening.     . TRIAMCINOLONE ACETONIDE 0.1 % EX CREA Topical Apply 1 application topically as needed. For skin irritation      BP 149/68  Pulse 98  Temp 99.9 F (37.7 C) (Oral)  Resp 20  Ht 5\' 9"  (1.753 m)  Wt 140 lb (63.504 kg)  BMI 20.67 kg/m2  SpO2 96%  Physical Exam  Nursing note and vitals reviewed. Constitutional: He is oriented to person, place, and time. He appears well-developed and well-nourished.  Non-toxic appearance.  HENT:  Head: Normocephalic.  Right Ear: Tympanic membrane and external ear normal.  Left Ear: Tympanic membrane and external ear normal.  Eyes: EOM and lids are normal. Pupils are equal, round, and reactive to light.  Neck: Normal range of motion. Neck supple. Carotid bruit is not present.  Cardiovascular: Normal rate, regular rhythm, intact distal pulses and normal pulses.  Exam reveals no gallop and no friction rub.   Murmur heard. Pulmonary/Chest: Breath sounds normal. No respiratory distress.       Coarse breath  sounds but no focal consolidation.  Abdominal: Soft. Bowel sounds are normal. There is no tenderness. There is no guarding.  Musculoskeletal: Normal range of motion. He exhibits no edema.       ... Catheter in left upper arm with good thrill.  Lymphadenopathy:       Head (right side): No submandibular adenopathy present.       Head (left side): No submandibular adenopathy present.    He has no cervical adenopathy.  Neurological: He is alert and oriented to person, place, and time. He has normal strength. No cranial nerve deficit or sensory deficit.  Skin: Skin is warm and dry.  Psychiatric: He  has a normal mood and affect. His speech is normal.    ED Course  Procedures (including critical care time)   Labs Reviewed  CBC WITH DIFFERENTIAL  BASIC METABOLIC PANEL  TROPONIN I   Dg Chest 2 View  01/24/2012  *RADIOLOGY REPORT*  Clinical Data: Chest pain, shortness of breath.  CHEST - 2 VIEW  Comparison: 01/19/2012  Findings: Prominent cardiomediastinal contours status post median sternotomy and CABG.  Interstitial prominence and minimal lung base opacities otherwise without focal consolidation or pneumothorax. Trace pleural fluid not excluded.  No acute osseous finding. Surgical clips within the posterior abdomen on the lateral view.  IMPRESSION: Prominent cardiomediastinal contours, similar to prior.  Mild increased interstitial prominence may reflect mild interstitial edema or atypical infection.  Mild lung base opacities, likely atelectasis. Trace pleural fluid not excluded.  Original Report Authenticated By: Waneta Martins, M.D.   EKG 10:24.  Rate-89. Rhythm-normal sinus. P wave - left atrial enlargement. PR-within normal limits. Axis-normal. QRS-left ventricular hypertrophy. ST-within normal limits. No STEMI. No Life threatening arrhythmia. Improved from Aug 1, a02:53 EKG.   No diagnosis found.    MDM  I have reviewed nursing notes, vital signs, and all appropriate lab and imaging  results for this patient. Patient has multiple medical problems. He was seen early this morning (2 AM) for cough, chest pain and shortness of breath. The patient was evaluated and his x-ray at that time revealed some increase fluids. Patient was advised to go to dialysis is today is the day of his dialysis which ED it and afterwards he was told by the staff at the dialysis Center to come back to the emergency apartment for evaluation for possible pneumonia. Complete blood count revealed a hemoglobin of 11.9 and hematocrit of 36.4 but otherwise was within normal limits. Basic metabolic panel revealed a sodium of 142 a potassium of 3.5 a chloride of 101 CO2 of 29 all within normal limits. The BUN was normal at 12, the creatinine was elevated at 2.0 to as expected in a patient with end-stage renal disease. A troponin was obtained and was found to be within normal limits. CT Angiocath revealed cardiomegaly with small bilateral effusion and bibasilar interstitial edema. 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 It was recommended that the patient have further evaluation of this mass by pulmonary or by MRI. The patient was also noted to have cholelithiasis but no evidence of cholecystitis. At discharge the pulse oximetry is 96% on room air. The patient speaks in complete sentences. There continues to BE some cough present. The patient was advised to see his primary physician and his renal physician for additional evaluation of this interstitial edema. Patient is given a prescription for promethazine/codeine cough medication to use until he is seen by his original position for additional evaluation and treatment. Patient patient was amateur 8 in the [facility] without complication.     a  Kathie Dike, [place] 01/24/12 1807

## 2012-01-24 NOTE — ED Notes (Signed)
Pt left in c/o friend for transport to dialysis; instructions reviewed- verbalizes understanding.

## 2012-01-25 NOTE — ED Provider Notes (Signed)
Medical screening examination/treatment/procedure(s) were conducted as a shared visit with non-physician practitioner(s) and myself.  I personally evaluated the patient during the encounter  Flint Melter, MD 01/25/12 1513

## 2012-01-30 ENCOUNTER — Encounter (HOSPITAL_COMMUNITY): Payer: Self-pay | Admitting: *Deleted

## 2012-01-30 ENCOUNTER — Emergency Department (HOSPITAL_COMMUNITY): Payer: Medicare Other

## 2012-01-30 ENCOUNTER — Emergency Department (HOSPITAL_COMMUNITY)
Admission: EM | Admit: 2012-01-30 | Discharge: 2012-01-31 | Disposition: A | Discharge status: Home / Self Care | Payer: Medicare Other | Attending: Emergency Medicine | Admitting: Emergency Medicine

## 2012-01-30 DIAGNOSIS — R109 Unspecified abdominal pain: Secondary | ICD-10-CM

## 2012-01-30 DIAGNOSIS — R590 Localized enlarged lymph nodes: Secondary | ICD-10-CM

## 2012-01-30 DIAGNOSIS — Z794 Long term (current) use of insulin: Secondary | ICD-10-CM | POA: Insufficient documentation

## 2012-01-30 DIAGNOSIS — E119 Type 2 diabetes mellitus without complications: Secondary | ICD-10-CM | POA: Insufficient documentation

## 2012-01-30 DIAGNOSIS — R110 Nausea: Secondary | ICD-10-CM | POA: Insufficient documentation

## 2012-01-30 DIAGNOSIS — I2510 Atherosclerotic heart disease of native coronary artery without angina pectoris: Secondary | ICD-10-CM | POA: Insufficient documentation

## 2012-01-30 DIAGNOSIS — I252 Old myocardial infarction: Secondary | ICD-10-CM | POA: Insufficient documentation

## 2012-01-30 DIAGNOSIS — N390 Urinary tract infection, site not specified: Secondary | ICD-10-CM | POA: Insufficient documentation

## 2012-01-30 DIAGNOSIS — R079 Chest pain, unspecified: Secondary | ICD-10-CM | POA: Insufficient documentation

## 2012-01-30 DIAGNOSIS — Z79899 Other long term (current) drug therapy: Secondary | ICD-10-CM | POA: Insufficient documentation

## 2012-01-30 DIAGNOSIS — Z951 Presence of aortocoronary bypass graft: Secondary | ICD-10-CM | POA: Insufficient documentation

## 2012-01-30 DIAGNOSIS — I120 Hypertensive chronic kidney disease with stage 5 chronic kidney disease or end stage renal disease: Secondary | ICD-10-CM | POA: Insufficient documentation

## 2012-01-30 DIAGNOSIS — N186 End stage renal disease: Secondary | ICD-10-CM | POA: Insufficient documentation

## 2012-01-30 DIAGNOSIS — Z992 Dependence on renal dialysis: Secondary | ICD-10-CM | POA: Insufficient documentation

## 2012-01-30 DIAGNOSIS — K5900 Constipation, unspecified: Secondary | ICD-10-CM | POA: Insufficient documentation

## 2012-01-30 DIAGNOSIS — R599 Enlarged lymph nodes, unspecified: Secondary | ICD-10-CM | POA: Insufficient documentation

## 2012-01-30 LAB — CBC WITH DIFFERENTIAL/PLATELET
Basophils Absolute: 0 10*3/uL (ref 0.0–0.1)
Basophils Relative: 0 % (ref 0–1)
Eosinophils Absolute: 0.1 10*3/uL (ref 0.0–0.7)
Eosinophils Relative: 1 % (ref 0–5)
HCT: 39.4 % (ref 39.0–52.0)
Hemoglobin: 13 g/dL (ref 13.0–17.0)
Lymphocytes Relative: 8 % — ABNORMAL LOW (ref 12–46)
Lymphs Abs: 0.9 10*3/uL (ref 0.7–4.0)
MCH: 31.6 pg (ref 26.0–34.0)
MCHC: 33 g/dL (ref 30.0–36.0)
MCV: 95.9 fL (ref 78.0–100.0)
Monocytes Absolute: 0.6 10*3/uL (ref 0.1–1.0)
Monocytes Relative: 5 % (ref 3–12)
Neutro Abs: 10 10*3/uL — ABNORMAL HIGH (ref 1.7–7.7)
Neutrophils Relative %: 85 % — ABNORMAL HIGH (ref 43–77)
Platelets: 245 10*3/uL (ref 150–400)
RBC: 4.11 MIL/uL — ABNORMAL LOW (ref 4.22–5.81)
RDW: 13.8 % (ref 11.5–15.5)
WBC: 11.7 10*3/uL — ABNORMAL HIGH (ref 4.0–10.5)

## 2012-01-30 MED ORDER — HYDROMORPHONE HCL PF 1 MG/ML IJ SOLN
0.5000 mg | Freq: Once | INTRAMUSCULAR | Status: AC
  Administered 2012-01-30: 0.5 mg via INTRAVENOUS
  Filled 2012-01-30: qty 1

## 2012-01-30 MED ORDER — SODIUM CHLORIDE 0.9 % IV BOLUS (SEPSIS)
500.0000 mL | Freq: Once | INTRAVENOUS | Status: AC
  Administered 2012-01-30: 500 mL via INTRAVENOUS

## 2012-01-30 MED ORDER — ONDANSETRON HCL 4 MG/2ML IJ SOLN
4.0000 mg | Freq: Once | INTRAMUSCULAR | Status: AC
  Administered 2012-01-30: 4 mg via INTRAVENOUS
  Filled 2012-01-30: qty 2

## 2012-01-30 NOTE — ED Notes (Signed)
Pt reporting constipation for a few days, states that he has not had results from laxative or suppository and now has begun having CP and SOB as well as pain in lower abdomen. Reports pain in central chest, states abdominal pain is worse.

## 2012-01-30 NOTE — ED Notes (Signed)
Pt resting quietly.  Reporting some improvement in chest pain, but continued abdominal discomfort.  Pt medicated for pain and nausea.  No additional needs verbalized.

## 2012-01-30 NOTE — ED Provider Notes (Signed)
History    This chart was scribed for Juan Razor, MD, MD by Smitty Pluck. The patient was seen in room APA09 and the patient's care was started at 11:11Pm.   CSN: [id number]  Arrival date & time 01/30/12  2215   First MD Initiated Contact with Patient 01/30/12 2302      Chief Complaint  Patient presents with  . Chest Pain    (Consider location/radiation/quality/duration/timing/severity/associated sxs/prior treatment) Patient is a 61 y.o. male presenting with chest pain. The history is provided by the patient.  Chest Pain    Juan Rowe is a 61 y.o. male who presents to the Emergency Department complaining of constant, moderate lower abdominal pain, nausea and chest pain onset today around 1200. Crampy lower abdominal pain which does not lateralize with occasional sharper pain.   Mild chest pain and "sweating bullets" when was on toilet when trying to have bowel movement which is why came to ED. He reports taking suppository without relief. He reports the abdominal pain is worsening and sharp. Lying down alleviates pain.   He reports having kidney removed due to cancer in 2007. Denies fevers, chills, dysuria and emesis.Acute myocardial infarction in December of 2012 with ischemic cardiomyopathy, status post 2 vessel coronary bypass grafting at [facility].    Past Medical History  Diagnosis Date  . Coronary artery disease   . MI, acute, non ST segment elevation December 2012    [facility]  . Diabetes mellitus   . Ischemic cardiomyopathy   . End stage renal disease on dialysis December 2012    Started dialysis  . Sarcoidosis   . Gout   . Renal cell carcinoma 2008  . Hypertension   . Blood loss anemia December 2012    Status post CABG. Transfused 2 units blood  . Acute respiratory failure December 2012    Secondary to MI  . Cardiogenic shock December 2012  . Depression     Past Surgical History  Procedure Date  . Nephrectomy 2008    On the left  .  Double-j ureteral stent placed   . Av fistula placement, radiocephalic   . Coronary artery bypass graft December 2012    Two-vessel, [facility].    History reviewed. No pertinent family history.  History  Substance Use Topics  . Smoking status: Never Smoker   . Smokeless tobacco: Never Used  . Alcohol Use: No      Review of Systems  Cardiovascular: Positive for chest pain.  All other systems reviewed and are negative.  10 Systems reviewed and all are negative for acute change except as noted in the HPI.    Allergies  Hydrocodone; Augmentin; and Sudafed  Home Medications   Current Outpatient Rx  Name Route Sig Dispense Refill  . ACETAMINOPHEN 325 MG PO TABS Oral Take 325-650 mg by mouth every 6 (six) hours as needed. For pain    . ALLOPURINOL 100 MG PO TABS Oral Take 100 mg by mouth daily.     . ASPIRIN EC 81 MG PO TBEC Oral Take 81 mg by mouth daily.      Marland Kitchen CALCITRIOL 0.25 MCG PO CAPS Oral Take 0.25 mcg by mouth every Monday, Wednesday, and Friday.      . OMEGA-3 FATTY ACIDS 1000 MG PO CAPS Oral Take 2 g by mouth 2 (two) times daily.    Marland Kitchen HYDROMORPHONE HCL 4 MG PO TABS Oral Take 0.5 tablets (2 mg total) by mouth every 4 (four)  hours as needed for pain. 20 tablet 0  . INSULIN ASPART 100 UNIT/ML [place] SOLN Subcutaneous Inject 10 Units into the skin 3 (three) times daily before meals. Sliding Scale: states 10 to unknown max amount    . INSULIN GLARGINE 100 UNIT/ML [place] SOLN Subcutaneous Inject 30 Units into the skin at bedtime.     Marland Kitchen LISINOPRIL 5 MG PO TABS Oral Take 2.5 mg by mouth daily.    Marland Kitchen METOPROLOL SUCCINATE ER 25 MG PO TB24 Oral Take 12.5 mg by mouth daily.    Marland Kitchen OMEPRAZOLE 20 MG PO CPDR Oral Take 20 mg by mouth daily.      Marland Kitchen POTASSIUM CHLORIDE CRYS ER 20 MEQ PO TBCR Oral Take 1 tablet (20 mEq total) by mouth daily. 30 tablet 0  . PREDNISONE 2.5 MG PO TABS Oral Take 2.5 mg by mouth daily.     Marland Kitchen PROMETHAZINE-CODEINE 6.25-10 MG/5ML PO SYRP Oral Take 5 mLs by mouth  every 4 (four) hours as needed for cough. 120 mL 0  . SIMVASTATIN 40 MG PO TABS Oral Take 20 mg by mouth every evening.     . TRIAMCINOLONE ACETONIDE 0.1 % EX CREA Topical Apply 1 application topically as needed. For skin irritation      BP 158/84  Pulse 74  Temp 98.2 F (36.8 C) (Oral)  Resp 18  Ht 5\' 9"  (1.753 m)  Wt 145 lb (65.772 kg)  BMI 21.41 kg/m2  SpO2 100%  Physical Exam  Nursing note and vitals reviewed. Constitutional: He appears well-developed and well-nourished. No distress.  HENT:  Head: Normocephalic and atraumatic.  Eyes: Conjunctivae are normal. Right eye exhibits no discharge. Left eye exhibits no discharge.  Neck: Neck supple.  Cardiovascular: Normal rate, regular rhythm and normal heart sounds.  Exam reveals no gallop and no friction rub.   No murmur heard. Pulmonary/Chest: Effort normal and breath sounds normal. No respiratory distress.  Abdominal: Soft. He exhibits no distension. There is tenderness (suprapubic). There is guarding. There is no rebound.       Well healed surgical scar in left flank  Musculoskeletal: He exhibits no edema and no tenderness.  Neurological: He is alert.  Skin: Skin is warm and dry.  Psychiatric: He has a normal mood and affect. His behavior is normal. Thought content normal.    ED Course  Procedures (including critical care time) DIAGNOSTIC STUDIES: Oxygen Saturation is 100% on room air, normal by my interpretation.    COORDINATION OF CARE: 11:16PM EDP discusses pt ED treatment with pt  11:26PM EDP ordered medication:  Scheduled Meds:   . HYDROmorphone  0.5 mg Intravenous Once  . ondansetron  4 mg Intravenous Once  . sodium chloride  500 mL Intravenous Once   Continuous Infusions:  PRN Meds:.    Labs Reviewed  COMPREHENSIVE METABOLIC PANEL - Abnormal; Notable for the following:    Glucose, Bld 255 (*)     BUN 32 (*)     Creatinine, Ser 4.29 (*)     GFR calc non Af Amer 13 (*)     GFR calc Af Amer 16 (*)      All other components within normal limits  URINALYSIS, ROUTINE W REFLEX MICROSCOPIC - Abnormal; Notable for the following:    Glucose, UA 500 (*)     Hgb urine dipstick TRACE (*)     Protein, ur 100 (*)     All other components within normal limits  CBC WITH DIFFERENTIAL - Abnormal; Notable for the following:  WBC 11.7 (*)     RBC 4.11 (*)     Neutrophils Relative 85 (*)     Neutro Abs 10.0 (*)     Lymphocytes Relative 8 (*)     All other components within normal limits  URINE MICROSCOPIC-ADD ON - Abnormal; Notable for the following:    Bacteria, UA FEW (*)     All other components within normal limits  LIPASE, BLOOD  TROPONIN I   Ct Abdomen Pelvis Wo Contrast  01/31/2012  *RADIOLOGY REPORT*  Clinical Data: Lower abdominal pain.  Chest pain.  Nausea. Constipation.  History of sarcoidosis, left nephrectomy for renal cell cancer.  CT ABDOMEN AND PELVIS WITHOUT CONTRAST  Technique:  Multidetector CT imaging of the abdomen and pelvis was performed following the standard protocol without intravenous contrast.  Comparison: CT chest 01/24/2012  Findings: Nodular infiltrates in the right middle lung with tree in bud pattern, incompletely included on this examination.  Changes appear to developed since previous study and may represent early pneumonia.  Aortic and coronary artery calcifications. Postoperative changes in the mediastinum.  Small stones in the gallbladder without distension or wall thickening.  The surgical absence of the left kidney.  No residual or recurrent mass.  There is evidence of lymphadenopathy in the periaortic region and pericaval region of the retroperitoneum, with lymph nodes measuring up to about 12 mm diameter.  Metastasis should be excluded.  Inflammatory lymph nodes can also have this appearance.  Celiac axis lymph nodes are not pathologically enlarged.  There is infiltration in the right pararenal fat without evidence of obstruction.  Changes may represent infection.  No  pyelocaliectasis or ureterectasis.  No renal, ureteral, or bladder stones visualized.  The bladder wall is not thickened.  The unenhanced appearance of the liver, spleen, pancreas, adrenal glands, and inferior vena cava is unremarkable.  Calcification of the abdominal aorta without aneurysm.  Prominent visceral adipose tissues.  No free air or free fluid in the abdomen.  The stomach, small bowel, and colon are not abnormally distended.  Pelvis:  Mild prostatic enlargement, measuring 5.4 x 4.1 cm.  Fat in the inguinal canals.  Diverticula in the sigmoid colon without diverticulitis.  The appendix is normal.  No free or loculated pelvic fluid collections.  No significant pelvic lymphadenopathy. Degenerative changes in the lumbar spine.  No destructive bone lesions appreciated.  IMPRESSION: Surgical absence of the left kidney.  Mildly enlarged retroperitoneal lymph nodes.  Metastasis should be excluded.  Small stones in the gallbladder without inflammatory change. Infiltration in the fat around the right kidney is nonspecific but could represent infection.  No evidence of ureteral obstruction or stone.  Original Report Authenticated By: Marlon Pel, M.D.    EKG:  Rhythm: normal sinus  Rate: 65 Axis: left Intervals/Conduction: nonspecific intraventricular delay. LAE. LVH. ST segments: ST depression inferiorly and laterally Comparison: abnormal EKG but stable from previous from 01/24/12  1. Abdominal pain   2. UTI (lower urinary tract infection)   3. Retroperitoneal lymphadenopathy       MDM  61yM with abdominal pain. Suprapubic tenderness. UA suggestive of infection. Will tx for UTI. Afebrile, no CVA tenderness and no vomiting. Feel that can appropriately be treated as an outpt at this time. Dose rocephin in ED prior to DC. Moderate amount of stool in colon and pt concerned about constipation. Laxative PRN. Discussed adenopathy with pt and of more of concern given hx of renal CA. Understands need  for FU of this. Cr elevated  but ESRD on HD.    I personally preformed the services scribed in my presence. The recorded information has been reviewed and considered. Juan Razor, MD.       Juan Razor, MD 01/31/12 [phone]

## 2012-01-31 LAB — COMPREHENSIVE METABOLIC PANEL
ALT: 9 U/L (ref 0–53)
AST: 12 U/L (ref 0–37)
Albumin: 3.6 g/dL (ref 3.5–5.2)
Alkaline Phosphatase: 114 U/L (ref 39–117)
BUN: 32 mg/dL — ABNORMAL HIGH (ref 6–23)
CO2: 24 mEq/L (ref 19–32)
Calcium: 9.1 mg/dL (ref 8.4–10.5)
Chloride: 100 mEq/L (ref 96–112)
Creatinine, Ser: 4.29 mg/dL — ABNORMAL HIGH (ref 0.50–1.35)
GFR calc Af Amer: 16 mL/min — ABNORMAL LOW (ref >90)
GFR calc non Af Amer: 13 mL/min — ABNORMAL LOW (ref >90)
Glucose, Bld: 255 mg/dL — ABNORMAL HIGH (ref 70–99)
Potassium: 4 mEq/L (ref 3.5–5.1)
Sodium: 139 mEq/L (ref 135–145)
Total Bilirubin: 0.4 mg/dL (ref 0.3–1.2)
Total Protein: 6.9 g/dL (ref 6.0–8.3)

## 2012-01-31 LAB — URINALYSIS, ROUTINE W REFLEX MICROSCOPIC
Bilirubin Urine: NEGATIVE
Glucose, UA: 500 mg/dL — AB
Ketones, ur: NEGATIVE mg/dL
Leukocytes, UA: NEGATIVE
Nitrite: NEGATIVE
Protein, ur: 100 mg/dL — AB
Specific Gravity, Urine: 1.02 (ref 1.005–1.030)
Urobilinogen, UA: 0.2 mg/dL (ref 0.0–1.0)
pH: 8 (ref 5.0–8.0)

## 2012-01-31 LAB — TROPONIN I: Troponin I: <0.3 ng/mL (ref <0.30)

## 2012-01-31 LAB — URINE MICROSCOPIC-ADD ON

## 2012-01-31 LAB — URINE CULTURE
Colony Count: NO GROWTH
Culture: NO GROWTH

## 2012-01-31 LAB — LIPASE, BLOOD: Lipase: 24 U/L (ref 11–59)

## 2012-01-31 MED ORDER — CEFTRIAXONE SODIUM 1 G IJ SOLR
1.0000 g | Freq: Once | INTRAMUSCULAR | Status: AC
  Administered 2012-01-31: 1 g via INTRAVENOUS
  Filled 2012-01-31: qty 10

## 2012-01-31 MED ORDER — POLYETHYLENE GLYCOL 3350 17 G PO PACK: 17.0000 g | PACK | Freq: Two times a day (BID) | ORAL | Status: AC | PRN

## 2012-01-31 MED ORDER — CIPROFLOXACIN HCL 500 MG PO TABS: 500.0000 mg | ORAL_TABLET | Freq: Two times a day (BID) | ORAL | Status: AC

## 2012-01-31 NOTE — ED Notes (Signed)
Pt reporting having a large BM and denies any complaints at present time.

## 2014-07-29 ENCOUNTER — Emergency Department (HOSPITAL_COMMUNITY)
Admission: EM | Admit: 2014-07-29 | Discharge: 2014-07-29 | Disposition: A | Discharge status: Home / Self Care | Payer: Non-veteran care | Attending: Emergency Medicine | Admitting: Emergency Medicine

## 2014-07-29 ENCOUNTER — Emergency Department (HOSPITAL_COMMUNITY): Payer: Non-veteran care

## 2014-07-29 ENCOUNTER — Encounter (HOSPITAL_COMMUNITY): Payer: Self-pay | Admitting: Emergency Medicine

## 2014-07-29 DIAGNOSIS — Z992 Dependence on renal dialysis: Secondary | ICD-10-CM | POA: Insufficient documentation

## 2014-07-29 DIAGNOSIS — Z951 Presence of aortocoronary bypass graft: Secondary | ICD-10-CM | POA: Diagnosis not present

## 2014-07-29 DIAGNOSIS — Y998 Other external cause status: Secondary | ICD-10-CM | POA: Diagnosis not present

## 2014-07-29 DIAGNOSIS — Z8709 Personal history of other diseases of the respiratory system: Secondary | ICD-10-CM | POA: Insufficient documentation

## 2014-07-29 DIAGNOSIS — F329 Major depressive disorder, single episode, unspecified: Secondary | ICD-10-CM | POA: Insufficient documentation

## 2014-07-29 DIAGNOSIS — M109 Gout, unspecified: Secondary | ICD-10-CM | POA: Insufficient documentation

## 2014-07-29 DIAGNOSIS — Z7982 Long term (current) use of aspirin: Secondary | ICD-10-CM | POA: Diagnosis not present

## 2014-07-29 DIAGNOSIS — Z792 Long term (current) use of antibiotics: Secondary | ICD-10-CM | POA: Insufficient documentation

## 2014-07-29 DIAGNOSIS — N186 End stage renal disease: Secondary | ICD-10-CM | POA: Insufficient documentation

## 2014-07-29 DIAGNOSIS — Y9289 Other specified places as the place of occurrence of the external cause: Secondary | ICD-10-CM | POA: Diagnosis not present

## 2014-07-29 DIAGNOSIS — Z79899 Other long term (current) drug therapy: Secondary | ICD-10-CM | POA: Insufficient documentation

## 2014-07-29 DIAGNOSIS — I252 Old myocardial infarction: Secondary | ICD-10-CM | POA: Insufficient documentation

## 2014-07-29 DIAGNOSIS — I120 Hypertensive chronic kidney disease with stage 5 chronic kidney disease or end stage renal disease: Secondary | ICD-10-CM | POA: Insufficient documentation

## 2014-07-29 DIAGNOSIS — Z862 Personal history of diseases of the blood and blood-forming organs and certain disorders involving the immune mechanism: Secondary | ICD-10-CM | POA: Insufficient documentation

## 2014-07-29 DIAGNOSIS — Z7952 Long term (current) use of systemic steroids: Secondary | ICD-10-CM | POA: Diagnosis not present

## 2014-07-29 DIAGNOSIS — Z794 Long term (current) use of insulin: Secondary | ICD-10-CM | POA: Insufficient documentation

## 2014-07-29 DIAGNOSIS — I2510 Atherosclerotic heart disease of native coronary artery without angina pectoris: Secondary | ICD-10-CM | POA: Insufficient documentation

## 2014-07-29 DIAGNOSIS — W01198A Fall on same level from slipping, tripping and stumbling with subsequent striking against other object, initial encounter: Secondary | ICD-10-CM | POA: Insufficient documentation

## 2014-07-29 DIAGNOSIS — E119 Type 2 diabetes mellitus without complications: Secondary | ICD-10-CM | POA: Diagnosis not present

## 2014-07-29 DIAGNOSIS — Z043 Encounter for examination and observation following other accident: Secondary | ICD-10-CM | POA: Insufficient documentation

## 2014-07-29 DIAGNOSIS — Y9301 Activity, walking, marching and hiking: Secondary | ICD-10-CM | POA: Insufficient documentation

## 2014-07-29 DIAGNOSIS — W19XXXA Unspecified fall, initial encounter: Secondary | ICD-10-CM

## 2014-07-29 LAB — I-STAT CHEM 8, ED
BUN: 47 mg/dL — AB (ref 6–23)
CALCIUM ION: 1.11 mmol/L — AB (ref 1.13–1.30)
Chloride: 103 mmol/L (ref 96–112)
Creatinine, Ser: 4.7 mg/dL — ABNORMAL HIGH (ref 0.50–1.35)
GLUCOSE: 103 mg/dL — AB (ref 70–99)
HEMATOCRIT: 42 % (ref 39.0–52.0)
HEMOGLOBIN: 14.3 g/dL (ref 13.0–17.0)
Potassium: 4.6 mmol/L (ref 3.5–5.1)
Sodium: 140 mmol/L (ref 135–145)
TCO2: 24 mmol/L (ref 0–100)

## 2014-07-29 LAB — CBG MONITORING, ED: GLUCOSE-CAPILLARY: 93 mg/dL (ref 70–99)

## 2014-07-29 NOTE — ED Provider Notes (Signed)
CSN: [id number]     Arrival date & time 07/29/14  0645 History  This chart was scribed for Glynn OctaveStephen Zareen Jamison, MD by Evon Slackerrance Branch, ED Scribe. This patient was seen in room APA06/APA06 and the patient's care was started at 7:04 AM.    Chief Complaint  Patient presents with  . Fall   Patient is a 64 y.o. male presenting with fall. The history is provided by the patient. No language interpreter was used.  Fall Pertinent negatives include no chest pain, no abdominal pain, no headaches and no shortness of breath.   HPI Comments: Juan Rowe is a 64 y.o. male with extensive medical Hx listed below who presents to the Emergency Department complaining of recurrent sudden fall onset this morning PTA. PT states that he was walking in to dialysis and his right leg gave out on him causing him to fall backwards. Pt states that he fell backwards onto his buttocks then back hitting his head. Pt denies LOC. Pt states that he was not able to get up on his own. Pt states that he is not on any blood thinners. Pt denies any pain at this time. Denies dizziness, light headedness, CP, SOB ,neck pain or back pain. Pt denies gait problem. Pt states that he has been recently hospitalized for bronchitis, pneumonia, and URI.     Past Medical History  Diagnosis Date  . Coronary artery disease   . MI, [facility], non ST segment elevation December 2012    [facility]  . Diabetes mellitus   . Ischemic cardiomyopathy   . End stage renal disease on dialysis December 2012    Started dialysis  . Sarcoidosis   . Gout   . Renal cell carcinoma 2008  . Hypertension   . Blood loss anemia December 2012    Status post CABG. Transfused 2 units blood  . Acute respiratory failure December 2012    Secondary to MI  . Cardiogenic shock December 2012  . Depression    Past Surgical History  Procedure Laterality Date  . Nephrectomy  2008    On the left  . Double-j ureteral stent placed    . Av fistula placement,  radiocephalic    . Coronary artery bypass graft  December 2012    [facility]   History reviewed. No pertinent family history. History  Substance Use Topics  . Smoking status: Never Smoker   . Smokeless tobacco: Never Used  . Alcohol Use: No    Review of Systems  Respiratory: Negative for shortness of breath.   Cardiovascular: Negative for chest pain.  Gastrointestinal: Negative for abdominal pain.  Musculoskeletal: Negative for back pain, gait problem and neck pain.  Neurological: Negative for dizziness, syncope and headaches.  All other systems reviewed and are negative.     Allergies  Hydrocodone; Augmentin; and Sudafed  Home Medications   Prior to Admission medications   Medication Sig Start Date End Date Taking? Authorizing Provider  acetaminophen (TYLENOL) 325 MG tablet Take 325-650 mg by mouth every 6 (six) hours as needed. For pain    Historical Provider, MD  allopurinol (ZYLOPRIM) 100 MG tablet Take 100 mg by mouth daily.     Historical Provider, MD  aspirin EC 81 MG tablet Take 81 mg by mouth daily.      Historical Provider, MD  calcitRIOL (ROCALTROL) 0.25 MCG capsule Take 0.25 mcg by mouth every Monday, Wednesday, and Friday.      Historical Provider, MD  fish oil-omega-3  fatty acids 1000 MG capsule Take 2 g by mouth 2 (two) times daily.    Historical Provider, MD  insulin aspart (NOVOLOG) 100 UNIT/ML injection Inject 10 Units into the skin 3 (three) times daily before meals. Sliding Scale: states 10 to unknown max amount    Historical Provider, MD  insulin glargine (LANTUS) 100 UNIT/ML injection Inject 30 Units into the skin at bedtime.     Historical Provider, MD  lisinopril (PRINIVIL,ZESTRIL) 5 MG tablet Take 2.5 mg by mouth daily.    Historical Provider, MD  metoprolol succinate (TOPROL-XL) 25 MG 24 hr tablet Take 12.5 mg by mouth daily.    Historical Provider, MD  omeprazole (PRILOSEC) 20 MG capsule Take 20 mg by mouth daily.      Historical  Provider, MD  potassium chloride SA (K-DUR,KLOR-CON) 20 MEQ tablet Take 1 tablet (20 mEq total) by mouth daily. 07/04/11 07/03/12  Elliot Cousin, MD  predniSONE (DELTASONE) 2.5 MG tablet Take 2.5 mg by mouth daily.     Historical Provider, MD  simvastatin (ZOCOR) 40 MG tablet Take 20 mg by mouth every evening.     Historical Provider, MD  triamcinolone cream (KENALOG) 0.1 % Apply 1 application topically as needed. For skin irritation    Historical Provider, MD   BP 134/83 mmHg  Pulse 98  Temp(Src) 97.5 F (36.4 C) (Oral)  Resp 20  Ht  (1.753 m)  Wt 160 lb (72.576 kg)  BMI 23.62 kg/m2  SpO2 99%   Physical Exam  Constitutional: He is oriented to person, place, and time. He appears well-developed and well-nourished. No distress.  HENT:  Head: Normocephalic and atraumatic.  Mouth/Throat: Oropharynx is clear and moist. No oropharyngeal exudate.  Eyes: Conjunctivae and EOM are normal. Pupils are equal, round, and reactive to light.  Neck: Normal range of motion. Neck supple.  No meningismus. No c-spine tenderness   Cardiovascular: Normal rate, regular rhythm, normal heart sounds and intact distal pulses.   No murmur heard.  intact femoral pulses  Pulmonary/Chest: Effort normal and breath sounds normal. No respiratory distress.  Abdominal: Soft. There is no tenderness. There is no rebound and no guarding.  Musculoskeletal: Normal range of motion. He exhibits no edema or tenderness.  intact dialysis fistula with thrill left forearm.  No thoracic or lumbar tenderness.   Neurological: He is alert and oriented to person, place, and time. No cranial nerve deficit. He exhibits normal muscle tone. Coordination normal.  No ataxia on finger to nose bilaterally. No pronator drift. 5/5 strength throughout. CN 2-12 intact. Negative Romberg. Equal grip strength. Sensation intact. Gait is normal.   Skin: Skin is warm.  Psychiatric: He has a normal mood and affect. His behavior is normal.  Nursing  note and vitals reviewed.   ED Course  Procedures (including critical care time) DIAGNOSTIC STUDIES: Oxygen Saturation is 96% on RA, adequate by my interpretation.    COORDINATION OF CARE: 7:21 AM-Discussed treatment plan with pt at bedside and pt agreed to plan.     Labs Review Labs Reviewed  I-STAT CHEM 8, ED - Abnormal; Notable for the following:    BUN 47 (*)    Creatinine, Ser 4.70 (*)    Glucose, Bld 103 (*)    Calcium, Ion 1.11 (*)    All other components within normal limits  CBG MONITORING, ED    Imaging Review Dg Chest 2 View  07/29/2014   CLINICAL DATA:  Fall, history of left renal cancer  EXAM: CHEST  2  VIEW  COMPARISON:  01/24/2012  FINDINGS: Cardiomegaly. Status post median sternotomy. Mild interstitial prominence bilaterally without pulmonary edema. No focal infiltrate. No pneumothorax. No gross fractures are noted. Mild degenerative changes lower thoracic and lumbar spine.  IMPRESSION: Cardiomegaly. Mild interstitial prominence bilaterally without convincing pulmonary edema. Status post median sternotomy. No pneumothorax.   Electronically Signed   By: Natasha Mead M.D.   On: 07/29/2014 08:17   Dg Lumbar Spine Complete  07/29/2014   CLINICAL DATA:  Fall this morning with gluteal injury. Initial encounter.  EXAM: LUMBAR SPINE - COMPLETE 4+ VIEW  COMPARISON:  Abdominal CT 01/31/2012  FINDINGS: No evidence of acute fracture or traumatic malalignment.  Focally advanced T12-L1 disc narrowing with spurring. No significant degenerative change in the lumbar spine for age. No endplate erosion.  IMPRESSION: No evidence of lumbar spine injury.   Electronically Signed   By: Tiburcio Pea M.D.   On: 07/29/2014 08:15   Ct Head Wo Contrast  07/29/2014   CLINICAL DATA:  Patient fell hitting back of head  EXAM: CT HEAD WITHOUT CONTRAST  TECHNIQUE: Contiguous axial images were obtained from the base of the skull through the vertex without intravenous contrast.  COMPARISON:  None.  FINDINGS:  There is moderate diffuse atrophy. There is no intracranial mass, hemorrhage, extra-axial fluid collection, or midline shift. There is mild patchy small vessel disease in the centra semiovale bilaterally. Elsewhere gray-white compartments appear normal. There is no demonstrable acute infarct. Bony calvarium appears intact. The mastoid air cells on the left are clear. There is opacification of most the mastoids on the right. There is mucosal thickening in the right maxillary antrum. There is minimal mucosal thickening in the left maxillary antrum. There is patchy opacity in multiple ethmoid air cells bilaterally.  IMPRESSION: Atrophy with mild patchy periventricular small vessel disease. No intracranial mass, hemorrhage, or extra-axial fluid collection. No acute infarct. Right-sided mastoid disease as well as multifocal paranasal sinus disease.   Electronically Signed   By: Bretta Bang M.D.   On: 07/29/2014 07:57     EKG Interpretation   Date/Time:  Thursday July 29 2014 07:21:14 EST Ventricular Rate:  82 PR Interval:  165 QRS Duration: 105 QT Interval:  390 QTC Calculation: 455 R Axis:   -41 Text Interpretation:  Sinus rhythm Atrial premature complex Probable left  atrial enlargement LVH with secondary repolarization abnormality Anterior  infarct, old No significant change was found Confirmed by Manus Gunning  MD,  Byron Tipping [phone]) on 07/29/2014 7:24:56 AM      MDM   Final diagnoses:  Fall, initial encounter  fall in parking lot family to dialysis after "right leg gave out". Hit head without loss of consciousness. Does not take any blood thinners. Denies any pain. No chest pain, abdominal pain, back pain. No dizziness or lightheadedness.  EKG is unchanged with frequent PACs.  Blood sugar 103. Potassium 4.6.  CT head and lumbar spine x-ray negative. Patient no distress. He is able to ambulate without difficulty. He denies pain. Denies dizziness or lightheadedness. He appears stable to  go directly to dialysis this morning.  I personally performed the services described in this documentation, which was scribed in my presence. The recorded information has been reviewed and is accurate.   Glynn Octave, MD 07/29/14 [phone]

## 2014-07-29 NOTE — ED Notes (Signed)
Pt states that he was walking getting ready to go to dialysis this morning and his right leg gave away and he fell hitting his head.  Pt denies any pain at this time but states that he does feel weak this morning.

## 2014-07-29 NOTE — Discharge Instructions (Signed)
Fall Prevention and Home Safety There appear to be no serious injuries from your fall. Go directly to dialysis. Return to the ED if you develop new or worsening symptoms. Falls cause injuries and can affect all age groups. It is possible to use preventive measures to significantly decrease the likelihood of falls. There are many simple measures which can make your home safer and prevent falls. OUTDOORS  Repair cracks and edges of walkways and driveways.  Remove high doorway thresholds.  Trim shrubbery on the main path into your home.  Have good outside lighting.  Clear walkways of tools, rocks, debris, and clutter.  Check that handrails are not broken and are securely fastened. Both sides of steps should have handrails.  Have leaves, snow, and ice cleared regularly.  Use sand or salt on walkways during winter months.  In the garage, clean up grease or oil spills. BATHROOM  Install night lights.  Install grab bars by the toilet and in the tub and shower.  Use non-skid mats or decals in the tub or shower.  Place a plastic non-slip stool in the shower to sit on, if needed.  Keep floors dry and clean up all water on the floor immediately.  Remove soap buildup in the tub or shower on a regular basis.  Secure bath mats with non-slip, double-sided rug tape.  Remove throw rugs and tripping hazards from the floors. BEDROOMS  Install night lights.  Make sure a bedside light is easy to reach.  Do not use oversized bedding.  Keep a telephone by your bedside.  Have a firm chair with side arms to use for getting dressed.  Remove throw rugs and tripping hazards from the floor. KITCHEN  Keep handles on pots and pans turned toward the center of the stove. Use back burners when possible.  Clean up spills quickly and allow time for drying.  Avoid walking on wet floors.  Avoid hot utensils and knives.  Position shelves so they are not too high or low.  Place commonly used  objects within easy reach.  If necessary, use a sturdy step stool with a grab bar when reaching.  Keep electrical cables out of the way.  Do not use floor polish or wax that makes floors slippery. If you must use wax, use non-skid floor wax.  Remove throw rugs and tripping hazards from the floor. STAIRWAYS  Never leave objects on stairs.  Place handrails on both sides of stairways and use them. Fix any loose handrails. Make sure handrails on both sides of the stairways are as long as the stairs.  Check carpeting to make sure it is firmly attached along stairs. Make repairs to worn or loose carpet promptly.  Avoid placing throw rugs at the top or bottom of stairways, or properly secure the rug with carpet tape to prevent slippage. Get rid of throw rugs, if possible.  Have an electrician put in a light switch at the top and bottom of the stairs. OTHER FALL PREVENTION TIPS  Wear low-heel or rubber-soled shoes that are supportive and fit well. Wear closed toe shoes.  When using a stepladder, make sure it is fully opened and both spreaders are firmly locked. Do not climb a closed stepladder.  Add color or contrast paint or tape to grab bars and handrails in your home. Place contrasting color strips on first and last steps.  Learn and use mobility aids as needed. Install an electrical emergency response system.  Turn on lights to avoid dark areas.  Replace light bulbs that burn out immediately. Get light switches that glow.  Arrange furniture to create clear pathways. Keep furniture in the same place.  Firmly attach carpet with non-skid or double-sided tape.  Eliminate uneven floor surfaces.  Select a carpet pattern that does not visually hide the edge of steps.  Be aware of all pets. OTHER HOME SAFETY TIPS  Set the water temperature for 120 F (48.8 C).  Keep emergency numbers on or near the telephone.  Keep smoke detectors on every level of the home and near sleeping  areas. Document Released: 06/01/2002 Document Revised: 12/11/2011 Document Reviewed: 08/31/2011 [facility] Patient Information 2015 [place], [place]. This information is not intended to replace advice given to you by your health care provider. Make sure you discuss any questions you have with your health care provider.

## 2014-10-17 NOTE — Op Note (Signed)
PATIENT NAME:  Juan Rowe, Juan Rowe MR#:  [id number] DATE OF BIRTH:  19-Feb-1951  DATE OF PROCEDURE:  07/20/2011  PREOPERATIVE DIAGNOSES:  1. Complication arteriovenous fistula, left wrist, with thrombosis.  2. End-stage renal disease requiring hemodialysis.  3. Hypertension.  4. Diabetes mellitus.  5. Agent Orange exposure.   POSTOPERATIVE DIAGNOSES:  1. Complication arteriovenous fistula, left wrist, with thrombosis. 2. End-stage renal disease requiring hemodialysis. 3. Hypertension. 4. Diabetes mellitus. 5. Agent Orange exposure.  PROCEDURES PERFORMED:  1. Contrast injection, left radiocephalic fistula.  2. Mechanical thrombectomy left arm radiocephalic fistula with the AngioJet.  3. Percutaneous transluminal angioplasty of the arterial portion of the arteriovenous fistula.  4. Percutaneous transluminal angioplasty of the venous portion of the arteriovenous fistula.  5. Percutaneous transluminal stent placement using an 8 x 50 straight FLAIR arterial portion.  6. Percutaneous transluminal angioplasty with stent placement of an 8 x 50 and an 8 x 40 FLAIR stent venous portion of the arteriovenous fistula.   SURGEON: Katha Cabal, MD   SEDATION: Versed 4 mg plus fentanyl 200 mcg administered IV. Continuous ECG, pulse oximetry and cardiopulmonary monitoring is performed throughout the entire procedure by the Interventional Radiology nurse. Total sedation time was one hour, 20 minutes.   ACCESS:  1. A 7-French sheath retrograde left radiocephalic fistula.  2. A 6-French sheath antegrade left radiocephalic fistula near the arterial anastomosis.   CONTRAST USED: Isovue 65 mL.  FLUOROSCOPY TIME: 10.8 minutes.   INDICATIONS: Mr. Prime is a 64 year old gentleman who presented with thrombosis of his left arm fistula. The risks and benefits for treatment of his fistula were reviewed. All questions are answered. The patient agrees to proceed.   DESCRIPTION OF PROCEDURE: The patient is  taken to Special Procedures and placed in the supine position. After adequate sedation is achieved, the left arm is positioned palm upward, extended, and prepped and draped in a sterile fashion. Ultrasound is placed in a sterile sleeve. Ultrasound is utilized secondary to lack of appropriate landmarks and to avoid vascular injury. Under direct ultrasound visualization, the fistula is identified at the level of the wrist in the mid  forearm. It is homogeneous with non-compressibility indicating thrombosis. As the fistula is followed more proximally near the antecubital fossa, it becomes echolucent, homogeneous, and easily compressible, indicating patency. At this level, 1% lidocaine is infiltrated in the soft tissues, and a micropuncture needle is used to access the fistula. A Microwire and micro sheath are inserted. A Glidewire is then negotiated through the micro sheath, and subsequently a 6-French sheath is inserted. Ultimately, using a combination of a floppy-angled glide, a KMP straight catheter and a V18 wire, access is achieved to the radial artery, and hand injection of contrast is used to verify that the radial artery remains patent. An 0.035 Magic Torque Wire is then inserted; 4 mg of TPA is reconstituted in 50 mL, and the AngioJet device is prepped onto the field. The TPA is then infused using the AngioJet in a spray, pulse spray modality; and the TPA is allowed to dwell within the thrombus for almost 30 minutes. The AngioJet is then once again engaged, and multiple passes are made aspirating thrombus from the cephalic vein. Hand injection of contrast now demonstrates multiple areas of stricture fairly diffusely throughout the course of the fistula, and a 6 x 8 balloon is advanced over the Magic Torque Wire, and beginning at the anastomosis used to angioplasty the fistula itself. Follow-up angiography demonstrates a small area of extravasation in the midportion  and inadequate result noted more distally  near the arterial anastomosis. Essentially these two persistent problems represent both arterial and venous portions of the fistula. Given the failed angioplasty, it is therefore elected to place stents; 8 mm diameter straight FLAIR stents are selected. A 50 length 8 x 50 straight FLAIR is then deployed just above the anastomosis where the first stenosis is noted. Moving toward the venous portion, a, 50 and a 40 length 8 mm diameter straight FLAIR are then deployed. Conquest, first a 7 x 4 and then an 8 x 4 balloon is used to dilate the arterial portion. Persistent thrombus is noted, and I am not able to dilate the venous side. Because of this, 1% lidocaine is infiltrated in the soft tissues just above the arterial anastomosis, and in an antegrade direction a 6-French sheath is placed using a Micropuncture kit as described earlier and subsequently working up to a 6-French sheath. The Magic Torque Wire is then advanced through the system, and the AngioJet is used. Again, 3 to 4 passes are made freeing thrombus; however, in the venous portion more proximally essentially at the level of the 7-French sheath there is persistent thrombus, and this is angioplastied with the Conquest balloon as well. Once access in both antegrade and retrograde directions had been accomplished, thrombectomy has been performed and dilatation is performed throughout the system using an 8 mm maximal diameter balloon. There is now rapid flow of contrast through the entire system, and at the level of the arterial anastomosis a palpable thrill is noted. Both sheaths are surrounded with a pursestring suture of 4-0 Monocryl and then removed with securing the Monocryl. Light pressure is applied. The patient tolerated the procedure well, and there were no immediate complications.   INTERPRETATION: Initial views demonstrate thrombus within the fistula up to almost the level of the antecubital fossa. Arterial anastomosis is widely patent, and the  radial artery is widely patent. Venous outflow is widely patent in the upper arm and more proximally. Following thrombectomy, there are multiple defects identified. These do not respond to a 6 mm inflation, and therefore FLAIR stents are placed as described above and post dilated to 8 mm.   SUMMARY: Successful salvage of left arm radiocephalic fistula with placement of FLAIR stents as described above.   ____________________________ Katha Cabal, MD ggs:cbb D: 07/21/2011 14:15:47 ET T: 07/21/2011 15:14:43 ET JOB#: [id number]  cc: Katha Cabal, MD, <Dictator> Fran Lowes, MD in [place] MD ELECTRONICALLY SIGNED 08/08/2011 11:09

## 2016-11-12 IMAGING — CT CT HEAD W/O CM
1 of 2 series · 15 of 30 positions shown, 19 images · non-contrast
Comparison: None.

CLINICAL DATA: Patient fell hitting back of head

EXAM:
CT HEAD WITHOUT CONTRAST
TECHNIQUE: Contiguous axial images were obtained from the base of the skull
through the vertex without intravenous contrast.

[Series 3: headtrauma 2.4 h60s · axial · 0.43mm/px · z∈[+102,+260]mm · 15 of 72 slices shown, 19 images]
[im 4/72  brain]
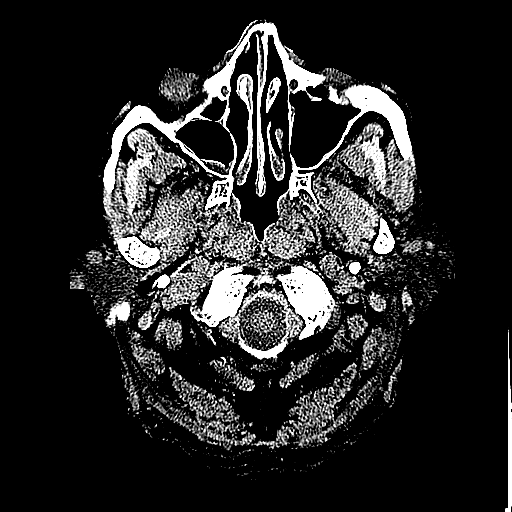
[im 4/72  bone]
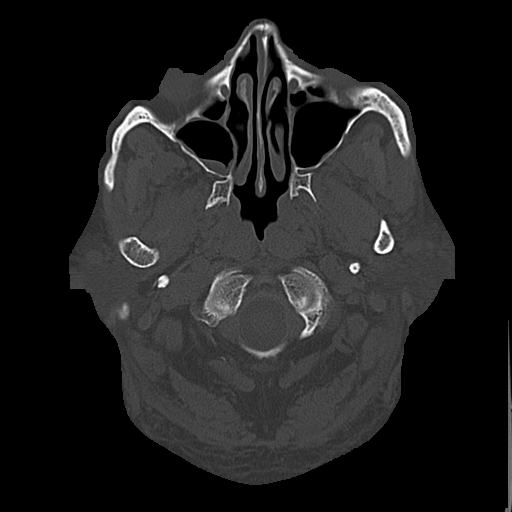
[im 8/72  brain]
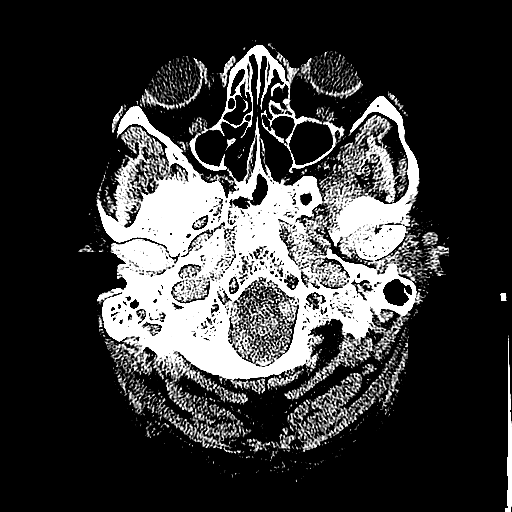
[im 15/72  brain]
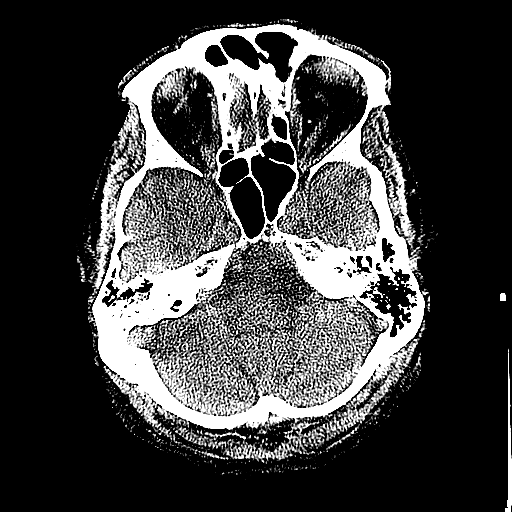
[im 19/72  brain]
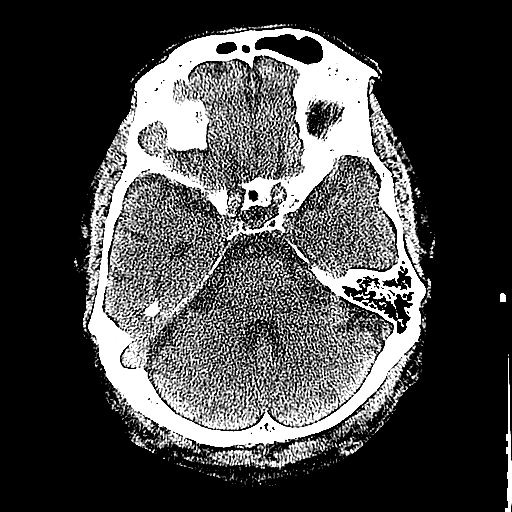
[im 23/72  brain]
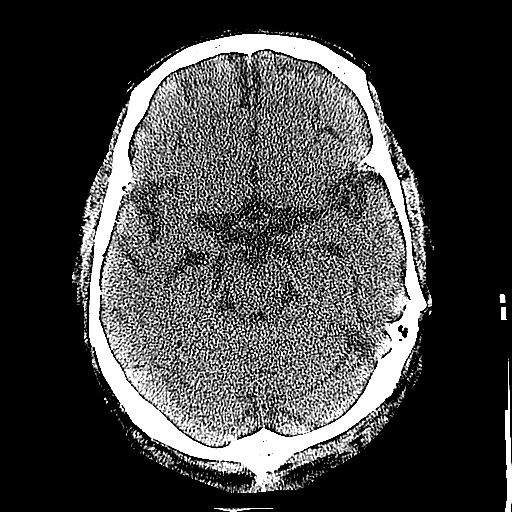
[im 23/72  bone]
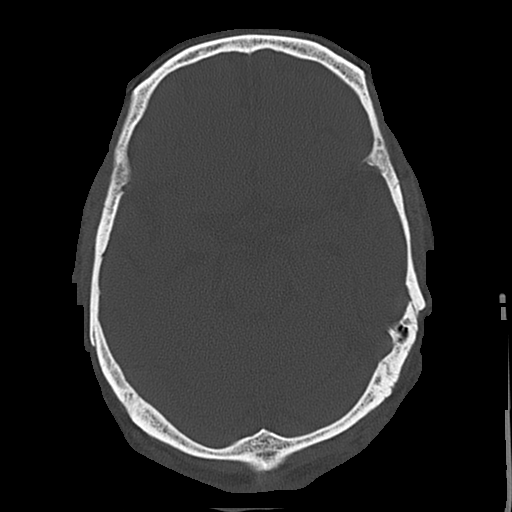
[im 27/72  brain]
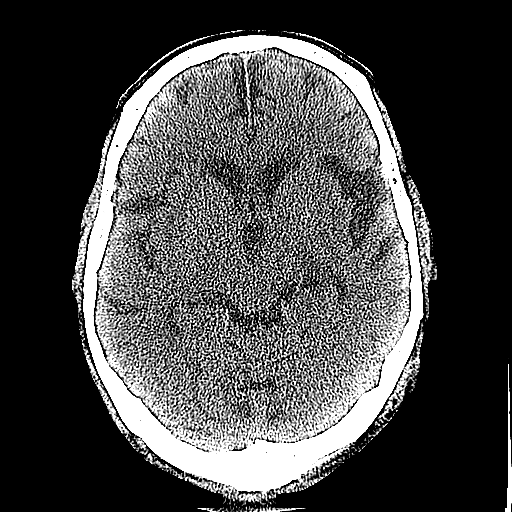
[im 30/72  brain]
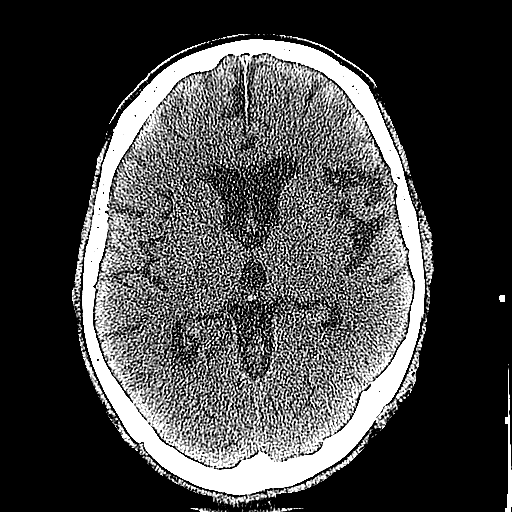
[im 38/72  brain]
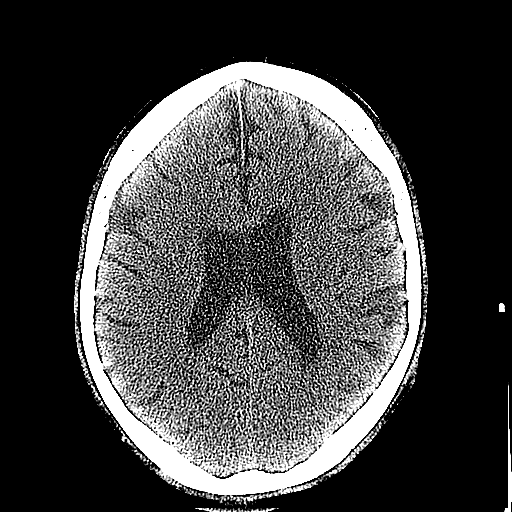
[im 42/72  brain]
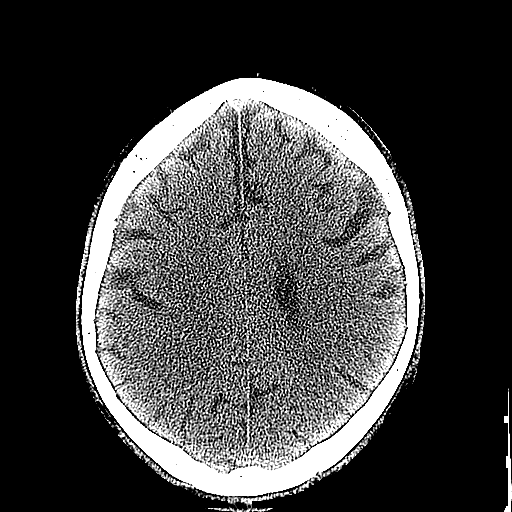
[im 42/72  bone]
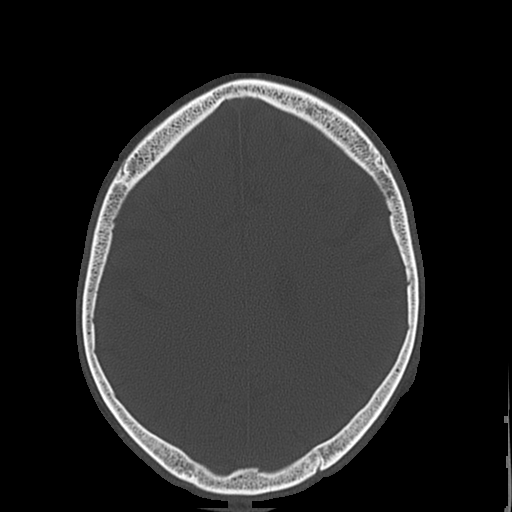
[im 45/72  brain]
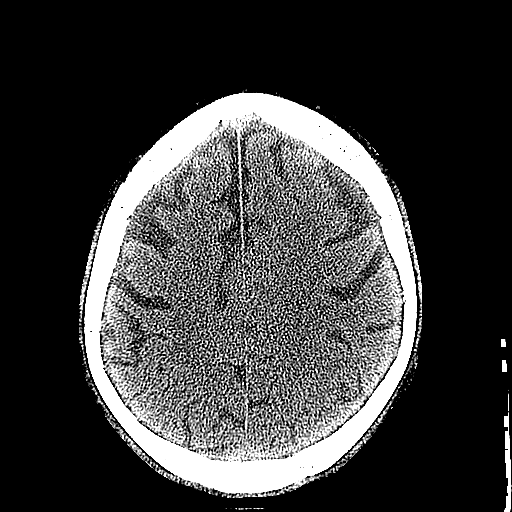
[im 49/72  brain]
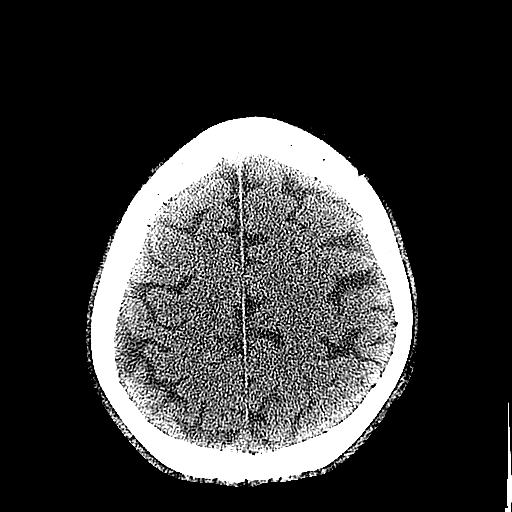
[im 53/72  brain]
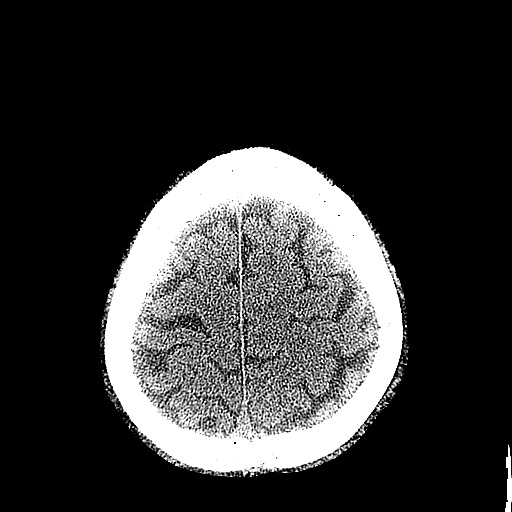
[im 60/72  brain]
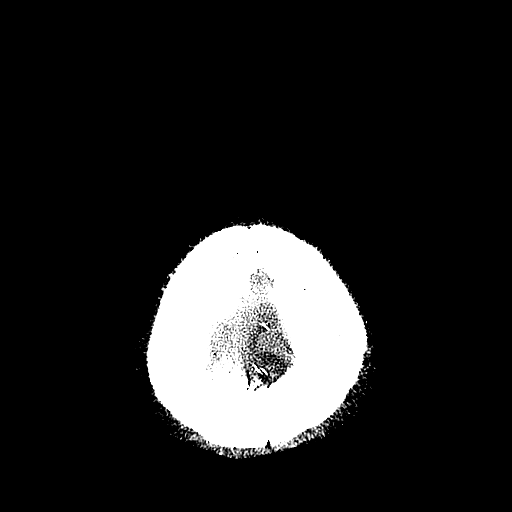
[im 60/72  bone]
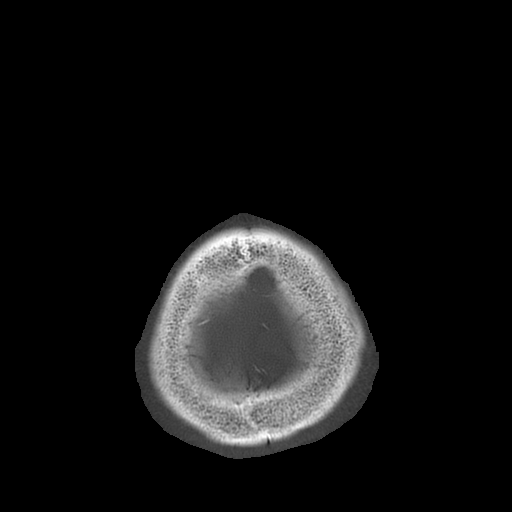
[im 64/72  brain]
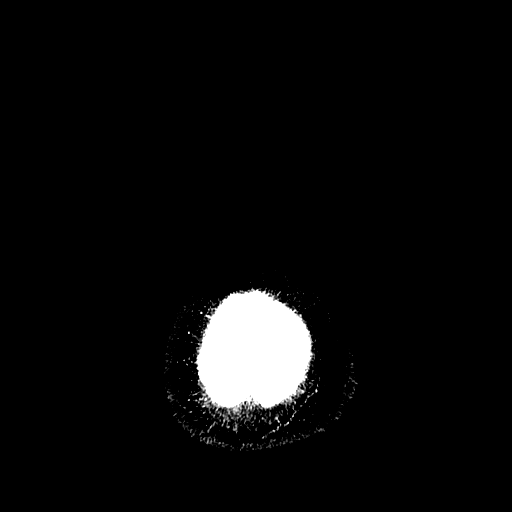
[im 68/72  brain]
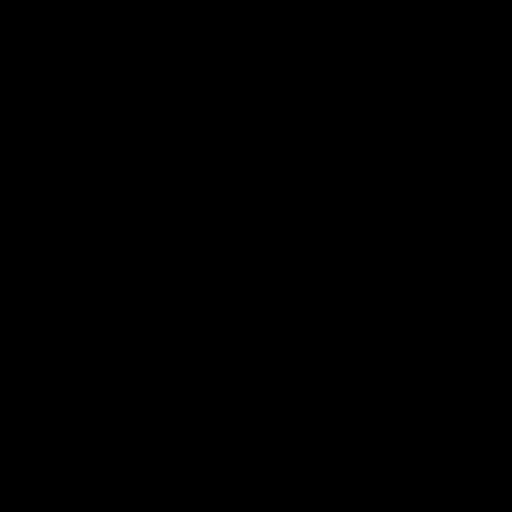

[15 of 30 positions shown; findings below may reference images not displayed]

FINDINGS: There is moderate diffuse atrophy. There is no intracranial mass,
hemorrhage, extra-axial fluid collection, or midline shift. There is
mild patchy small vessel disease in the centra semiovale
bilaterally. Elsewhere gray-white compartments appear normal. There
is no demonstrable acute infarct. Bony calvarium appears intact. The
mastoid air cells on the left are clear. There is opacification of
most the mastoids on the right. There is mucosal thickening in the
right maxillary antrum. There is minimal mucosal thickening in the
left maxillary antrum. There is patchy opacity in multiple ethmoid
air cells bilaterally.
IMPRESSION: Atrophy with mild patchy periventricular small vessel disease. No
intracranial mass, hemorrhage, or extra-axial fluid collection. No
acute infarct. Right-sided mastoid disease as well as multifocal
paranasal sinus disease.

## 2017-01-25 ENCOUNTER — Ambulatory Visit: Payer: Non-veteran care | Admitting: Podiatry

## 2017-02-13 ENCOUNTER — Ambulatory Visit (INDEPENDENT_AMBULATORY_CARE_PROVIDER_SITE_OTHER): Payer: Non-veteran care | Admitting: Podiatry

## 2017-02-13 ENCOUNTER — Encounter: Payer: Self-pay | Admitting: Podiatry

## 2017-02-13 VITALS — BP 130/74 | HR 80 | Resp 16

## 2017-02-13 DIAGNOSIS — E1140 Type 2 diabetes mellitus with diabetic neuropathy, unspecified: Secondary | ICD-10-CM | POA: Diagnosis not present

## 2017-02-13 DIAGNOSIS — Z9889 Other specified postprocedural states: Secondary | ICD-10-CM

## 2017-02-13 DIAGNOSIS — E1151 Type 2 diabetes mellitus with diabetic peripheral angiopathy without gangrene: Secondary | ICD-10-CM

## 2017-02-13 DIAGNOSIS — E1149 Type 2 diabetes mellitus with other diabetic neurological complication: Secondary | ICD-10-CM | POA: Diagnosis not present

## 2017-02-13 DIAGNOSIS — Z89419 Acquired absence of unspecified great toe: Secondary | ICD-10-CM | POA: Diagnosis not present

## 2017-02-13 NOTE — Progress Notes (Signed)
   Subjective:    Patient ID: Juan Rowe, male    DOB: [birth year], 66 y.o.   MRN: [id number]  HPI Chief Complaint  Patient presents with  . Toe Amputation    Right foot; pt stated, "Had a toe amputation 4 weeks ago at the [facility] after an infection; scheduled to have stitches out this Friday at the [place]; requesting to get a Podiatrist after they are released from the [place]"      Review of Systems  HENT: Positive for tinnitus.   Gastrointestinal: Positive for diarrhea.  Skin: Positive for rash.  Neurological: Positive for weakness and numbness.  Hematological: Bruises/bleeds easily.  All other systems reviewed and are negative.      Objective:   Physical Exam        Assessment & Plan:

## 2017-02-14 NOTE — Progress Notes (Signed)
Subjective:    Patient ID: Juan Rowe, male   DOB: 66 y.o.   MRN: [id number]   HPI patient presents stating he had an amputation of his right big toe is had his left big toe amputated in the past and he's frustrated that he had to have this done as she was delayed in being seen for several months    Review of Systems  All other systems reviewed and are negative.       Objective:  Physical Exam  Constitutional: He appears well-developed and well-nourished.  Musculoskeletal: Normal range of motion.  Neurological: He is alert.  Skin: Skin is warm.  Nursing note and vitals reviewed.  vascular status did not have palpable pulses and I did note there is ecchymosis around the right big toe where it was amputated at the metatarsophalangeal joint with stitches intact and crusted tissue. It is only approximate one week after amputation and the left big toe has been amputated in the past     Assessment:  At risk diabetic with infection bilateral and chances for circulatory disease       Plan:   Reviewed the importance of diabetic shoes with toe fillers and we will have these made for him and at this point I did go ahead and I did do ABIs and found surgery status of the ankle is good but I'm concerned about the microcirculation but I explained there is most likely nothing that can be done in order to increase that due to the fact it is within the small vessels. At this point we will just monitor him and then get approval to have diabetic shoes with toe fillers made for him and will have that done. Other than that it'll continue be followed at the [facility] for care of his amputation site where the surgery was done

## 2019-09-24 DEATH — deceased
